# Patient Record
Sex: Female | Born: 1965 | Race: White | Hispanic: No | Marital: Married | Smoking: Never smoker
Health system: Southern US, Community
[De-identification: ages and names within clinical notes are randomized; demographics above are authoritative.]

## PROBLEM LIST (undated history)

## (undated) DIAGNOSIS — T7421XA Adult sexual abuse, confirmed, initial encounter: Secondary | ICD-10-CM

## (undated) DIAGNOSIS — B019 Varicella without complication: Secondary | ICD-10-CM

## (undated) DIAGNOSIS — M810 Age-related osteoporosis without current pathological fracture: Secondary | ICD-10-CM

## (undated) DIAGNOSIS — C3490 Malignant neoplasm of unspecified part of unspecified bronchus or lung: Secondary | ICD-10-CM

## (undated) DIAGNOSIS — L508 Other urticaria: Secondary | ICD-10-CM

## (undated) DIAGNOSIS — G43909 Migraine, unspecified, not intractable, without status migrainosus: Secondary | ICD-10-CM

## (undated) DIAGNOSIS — R32 Unspecified urinary incontinence: Secondary | ICD-10-CM

## (undated) DIAGNOSIS — R87619 Unspecified abnormal cytological findings in specimens from cervix uteri: Secondary | ICD-10-CM

## (undated) DIAGNOSIS — A64 Unspecified sexually transmitted disease: Secondary | ICD-10-CM

## (undated) DIAGNOSIS — K219 Gastro-esophageal reflux disease without esophagitis: Secondary | ICD-10-CM

## (undated) DIAGNOSIS — T7840XA Allergy, unspecified, initial encounter: Secondary | ICD-10-CM

## (undated) DIAGNOSIS — J9819 Other pulmonary collapse: Secondary | ICD-10-CM

## (undated) DIAGNOSIS — F419 Anxiety disorder, unspecified: Secondary | ICD-10-CM

## (undated) DIAGNOSIS — O020 Blighted ovum and nonhydatidiform mole: Secondary | ICD-10-CM

## (undated) HISTORY — DX: Migraine, unspecified, not intractable, without status migrainosus: G43.909

## (undated) HISTORY — DX: Other pulmonary collapse: J98.19

## (undated) HISTORY — DX: Anxiety disorder, unspecified: F41.9

## (undated) HISTORY — DX: Unspecified urinary incontinence: R32

## (undated) HISTORY — PX: GYNECOLOGIC CRYOSURGERY: SHX857

## (undated) HISTORY — DX: Age-related osteoporosis without current pathological fracture: M81.0

## (undated) HISTORY — DX: Malignant neoplasm of unspecified part of unspecified bronchus or lung: C34.90

## (undated) HISTORY — DX: Varicella without complication: B01.9

## (undated) HISTORY — DX: Blighted ovum and nonhydatidiform mole: O02.0

## (undated) HISTORY — DX: Unspecified sexually transmitted disease: A64

## (undated) HISTORY — DX: Other urticaria: L50.8

## (undated) HISTORY — PX: WRIST SURGERY: SHX841

## (undated) HISTORY — DX: Adult sexual abuse, confirmed, initial encounter: T74.21XA

## (undated) HISTORY — DX: Unspecified abnormal cytological findings in specimens from cervix uteri: R87.619

## (undated) HISTORY — DX: Gastro-esophageal reflux disease without esophagitis: K21.9

## (undated) HISTORY — DX: Allergy, unspecified, initial encounter: T78.40XA

## (undated) HISTORY — PX: EYE SURGERY: SHX253

---
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
## 2017-02-12 HISTORY — PX: COLONOSCOPY: SHX174

## 2017-11-28 DIAGNOSIS — K219 Gastro-esophageal reflux disease without esophagitis: Secondary | ICD-10-CM

## 2017-11-28 DIAGNOSIS — N941 Unspecified dyspareunia: Secondary | ICD-10-CM | POA: Insufficient documentation

## 2017-11-28 DIAGNOSIS — J301 Allergic rhinitis due to pollen: Secondary | ICD-10-CM

## 2017-11-28 DIAGNOSIS — Z780 Asymptomatic menopausal state: Secondary | ICD-10-CM | POA: Insufficient documentation

## 2017-11-28 HISTORY — DX: Allergic rhinitis due to pollen: J30.1

## 2017-11-28 HISTORY — DX: Unspecified dyspareunia: N94.10

## 2017-11-28 HISTORY — DX: Gastro-esophageal reflux disease without esophagitis: K21.9

## 2017-12-22 DIAGNOSIS — M810 Age-related osteoporosis without current pathological fracture: Secondary | ICD-10-CM | POA: Insufficient documentation

## 2018-02-10 DIAGNOSIS — Z86010 Personal history of colonic polyps: Secondary | ICD-10-CM | POA: Insufficient documentation

## 2018-03-06 ENCOUNTER — Other Ambulatory Visit: Payer: Self-pay

## 2018-03-06 ENCOUNTER — Ambulatory Visit: Payer: 59 | Admitting: Certified Nurse Midwife

## 2018-03-06 ENCOUNTER — Other Ambulatory Visit (HOSPITAL_COMMUNITY)
Admission: RE | Admit: 2018-03-06 | Discharge: 2018-03-06 | Disposition: A | Discharge status: Home / Self Care | Payer: 59 | Source: Ambulatory Visit | Attending: Certified Nurse Midwife | Admitting: Certified Nurse Midwife

## 2018-03-06 ENCOUNTER — Encounter: Payer: Self-pay | Admitting: Certified Nurse Midwife

## 2018-03-06 VITALS — BP 120/78 | HR 68 | Resp 16 | Ht 61.25 in | Wt 115.0 lb

## 2018-03-06 DIAGNOSIS — Z124 Encounter for screening for malignant neoplasm of cervix: Secondary | ICD-10-CM | POA: Diagnosis not present

## 2018-03-06 DIAGNOSIS — Z01419 Encounter for gynecological examination (general) (routine) without abnormal findings: Secondary | ICD-10-CM

## 2018-03-06 DIAGNOSIS — N951 Menopausal and female climacteric states: Secondary | ICD-10-CM

## 2018-03-06 DIAGNOSIS — Z8739 Personal history of other diseases of the musculoskeletal system and connective tissue: Secondary | ICD-10-CM

## 2018-03-06 NOTE — Patient Instructions (Signed)
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
## 2018-03-10 LAB — CYTOLOGY - PAP
Diagnosis: NEGATIVE
HPV: NOT DETECTED

## 2018-03-14 ENCOUNTER — Ambulatory Visit: Payer: Self-pay | Admitting: Family Medicine

## 2018-03-19 ENCOUNTER — Telehealth: Payer: Self-pay | Admitting: Certified Nurse Midwife

## 2018-03-19 ENCOUNTER — Other Ambulatory Visit: Payer: Self-pay

## 2018-03-19 ENCOUNTER — Encounter: Payer: Self-pay | Admitting: Certified Nurse Midwife

## 2018-03-19 ENCOUNTER — Ambulatory Visit: Payer: 59 | Admitting: Certified Nurse Midwife

## 2018-03-19 VITALS — BP 110/64 | HR 68 | Temp 98.2°F | Resp 16 | Wt 117.0 lb

## 2018-03-19 DIAGNOSIS — N952 Postmenopausal atrophic vaginitis: Secondary | ICD-10-CM | POA: Diagnosis not present

## 2018-03-19 DIAGNOSIS — Z01419 Encounter for gynecological examination (general) (routine) without abnormal findings: Secondary | ICD-10-CM

## 2018-03-19 MED ORDER — ESTRADIOL 10 MCG VA TABS: ORAL_TABLET | VAGINAL | 1 refills | Status: DC

## 2018-03-19 NOTE — Telephone Encounter (Signed)
Message   Hello Neoma Laming,    I forgot to right down the name of the cream you recommend to put on when I am in the shower. Would you please let me know.    Thank-you,  Messiah

## 2018-03-19 NOTE — Telephone Encounter (Signed)
Left message to call Saurav Crumble, RN at [facility] [phone].   

## 2018-03-19 NOTE — Telephone Encounter (Signed)
Call reviewed with Melvia Heaps, CNM, creamy baby oil recommended. MyChart message to patient.   Encounter closed.

## 2018-03-19 NOTE — Patient Instructions (Signed)
Atrophic Vaginitis Atrophic vaginitis is a condition in which the tissues that line the vagina become dry and thin. This condition occurs in women who have stopped having their period. It is caused by a drop in a female hormone (estrogen). This hormone helps:  To keep the vagina moist.  To make a clear fluid. This clear fluid helps: ? To make the vagina ready for sex. ? To protect the vagina from infection. If the lining of the vagina is dry and thin, it may cause irritation, burning, or itchiness. It may also:  Make sex painful.  Make an exam of your vagina painful.  Cause bleeding.  Make you lose interest in sex.  Cause a burning feeling when you pee (urinate).  Cause a brown or yellow fluid to come from your vagina. Some women do not have symptoms. Follow these instructions at home: Medicines  Take over-the-counter and prescription medicines only as told by your doctor.  Do not use herbs or other medicines unless your doctor says it is okay.  Use medicines for for dryness. These include: ? Oils to make the vagina soft. ? Creams. ? Moisturizers. General instructions  Do not douche.  Do not use products that can make your vagina dry. These include: ? Scented sprays. ? Scented tampons. ? Scented soaps.  Sex can help increase blood flow and soften the tissue in the vagina. If it hurts to have sex: ? Tell your partner. ? Use products to make sex more comfortable. Use these only as told by your doctor. Contact a doctor if you:  Have discharge from the vagina that is different than usual.  Have a bad smell coming from your vagina.  Have new symptoms.  Do not get better.  Get worse. Summary  Atrophic vaginitis is a condition in which the lining of the vagina becomes dry and thin.  This condition affects women who have stopped having their periods.  Treatment may include using products that help make the vagina soft.  Call a doctor if do not get better with  treatment. This information is not intended to replace advice given to you by your health care provider. Make sure you discuss any questions you have with your health care provider. Document Released: 07/18/2007 Document Revised: 02/11/2017 Document Reviewed: 02/11/2017 Elsevier Interactive Patient Education  2019 [employer].

## 2018-03-19 NOTE — Progress Notes (Signed)
Review of Systems  Constitutional: Positive for malaise/fatigue. Negative for chills and fever.  HENT: Negative.   Eyes: Negative.   Respiratory: Negative.   Cardiovascular: Negative.   Gastrointestinal: Negative.   Genitourinary: Negative.        Vaginal dryness and discomfort  Musculoskeletal: Positive for myalgias.       Treated for flu 4 days ago  Skin: Negative.   Neurological: Negative.   Endo/Heme/Allergies: Negative.   Psychiatric/Behavioral: Negative.     53 y.o. Married Caucasian female G2P0010 here for follow up of vaginal dryness treated with coconut oil initiated on 03/07/2018. Patient has continued to use nightly until she contracted the flu. Treated with Tamiflu and OTC cough medication. Onset 4 days ago. Feeling much better, but still fatigued. Patient still having hot flashes occasional at hs. Sleeping better at times. Ready to feel better. "Need to get the vaginal dryness resolved". Denies vaginal bleeding. No other health issues today.   O: Healthy WD,WN female Affect: Normal, orientation x 3 Skin:warm and dry Abdomen:soft, non tender Inguinal lymph nodes : no enlargement or tenderness Pelvic exam:EXTERNAL GENITALIA: normal appearing vulva with no masses,  or lesions. Dryness noted with slight increase pink, no exudate or scaling VAGINA: atrophic appearance with pale color and scant moisture, slightly tender CERVIX: no lesions or cervical motion tenderness and normal appearance UTERUS: anteverted and non tender, no masses ADNEXA: no masses palpable and nontender RECTUM: normal appearance, no redness or skin irritation noted  A: Atrophic vaginitis using coconut oil for moisture, but still having discomfort. Normal pelvic exam Menopausal symptomatic Recent treatment for the Flu   P: Discussed findings of Atrophic vaginitis and discussed etiology again. Discussed small improvement with coconut oil moisture. Discussed Vagifem use for atrophic changes.  Risks/benefits/warning signs discussed. Patient would like to try, due to discomfort. Discussed she will absorb small amount systemically, which may help with hot flashes. Questions addressed. Rx Vagifem(generic) one tablet vaginally every hs x 1 week then twice weekly. Printed Rx given to patient at her request to price compare. Warning signs given and need to advise if occurs. Continue coconut oil at least twice weekly and with sexual activity. Continue follow up with PCP as indicated.  Rv one month

## 2018-04-24 ENCOUNTER — Encounter: Payer: Self-pay | Admitting: Certified Nurse Midwife

## 2018-04-24 ENCOUNTER — Other Ambulatory Visit: Payer: Self-pay

## 2018-04-24 ENCOUNTER — Ambulatory Visit (INDEPENDENT_AMBULATORY_CARE_PROVIDER_SITE_OTHER): Payer: 59 | Admitting: Certified Nurse Midwife

## 2018-04-24 VITALS — BP 120/78 | HR 60 | Resp 16 | Wt 114.0 lb

## 2018-04-24 DIAGNOSIS — N952 Postmenopausal atrophic vaginitis: Secondary | ICD-10-CM | POA: Diagnosis not present

## 2018-04-24 MED ORDER — ESTRADIOL 10 MCG VA TABS: ORAL_TABLET | VAGINAL | 1 refills | Status: DC

## 2018-04-24 NOTE — Progress Notes (Signed)
Review of Systems  Constitutional: Negative.   HENT: Negative.   Eyes: Negative.   Respiratory: Negative.   Cardiovascular: Negative.   Gastrointestinal: Negative.   Genitourinary: Negative.   Musculoskeletal: Negative.   Skin: Negative.   Neurological: Negative.   Endo/Heme/Allergies: Negative.   Psychiatric/Behavioral: Negative.     53 y.o. Married Caucasian female G2P0020 here for follow up of atrophic vaginitis related to menopause treated with Vagifem initiated on 03/19/2018. Patient using medication as directed every hs x 1 week and now twice weekly with coconut oil the other days. Feels less tender but not 100%. Denies vaginal bleeding or other concerns with medication use. Thankful it is improving.   O: Healthy WD,WN female Affect: normal, orientation x 3 Skin:warm and dry Abdomen:soft, non tender Inguinal lymph nodes not enlarged Pelvic exam:EXTERNAL GENITALIA: normal appearing vulva with no masses, tenderness or lesions VAGINA: no abnormal discharge or lesions and moisture noted in posture fornix, slightly tender with exam, improvement noted CERVIX: no lesions or cervical motion tenderness UTERUS: anteverted and non tender, no masse ADNEXA: no masses palpable and nontender RECTUM: normal appearance  A: Normal pelvic exam Atrophic Vaginitis improving with Vagifem use    P: Discussed findings of improvement, but feel another week of nightly Vagifem would increase improvement. Patient agreeable to plan of nightly x 1 week, coconut oil as needed and then twice weekly with coconut oil other nights. Rx Vagifem see order with instructions  Rv 3 weeks, prn

## 2018-04-24 NOTE — Patient Instructions (Signed)
Atrophic Vaginitis    Atrophic vaginitis is a condition in which the tissues that line the vagina become dry and thin. This condition is most common in women who have stopped having regular menstrual periods (are in menopause). This usually starts when a woman is 45-53 years old. That is the time when a woman's estrogen levels begin to drop (decrease).  Estrogen is a female hormone. It helps to keep the tissues of the vagina moist. It stimulates the vagina to produce a clear fluid that lubricates the vagina for sexual intercourse. This fluid also protects the vagina from infection. Lack of estrogen can cause the lining of the vagina to get thinner and dryer. The vagina may also shrink in size. It may become less elastic. Atrophic vaginitis tends to get worse over time as a woman's estrogen level drops.  What are the causes?  This condition is caused by the normal drop in estrogen that happens around the time of menopause.  What increases the risk?  Certain conditions or situations may lower a woman's estrogen level, leading to a higher risk for atrophic vaginitis. You are more likely to develop this condition if:   You are taking medicines that block estrogen.   You have had your ovaries removed.   You are being treated for cancer with X-ray (radiation) or medicines (chemotherapy).   You have given birth or are breastfeeding.   You are older than age 50.   You smoke.  What are the signs or symptoms?  Symptoms of this condition include:   Pain, soreness, or bleeding during sexual intercourse (dyspareunia).   Vaginal burning, irritation, or itching.   Pain or bleeding when a speculum is used in a vaginal exam (pelvic exam).   Having burning pain when passing urine.   Vaginal discharge that is brown or yellow.  In some cases, there are no symptoms.  How is this diagnosed?  This condition is diagnosed by taking a medical history and doing a physical exam. This will include a pelvic exam that checks the  vaginal tissues. Though rare, you may also have other tests, including:   A urine test.   A test that checks the acid balance in your vagina (acid balance test).  How is this treated?  Treatment for this condition depends on how severe your symptoms are. Treatment may include:   Using an over-the-counter vaginal lubricant before sex.   Using a long-acting vaginal moisturizer.   Using low-dose vaginal estrogen for moderate to severe symptoms that do not respond to other treatments. Options include creams, tablets, and inserts (vaginal rings). Before you use a vaginal estrogen, tell your health care provider if you have a history of:  ? Breast cancer.  ? Endometrial cancer.  ? Blood clots.  If you are not sexually active and your symptoms are very mild, you may not need treatment.  Follow these instructions at home:  Medicines   Take over-the-counter and prescription medicines only as told by your health care provider. Do not use herbal or alternative medicines unless your health care provider says that you can.   Use over-the-counter creams, lubricants, or moisturizers for dryness only as directed by your health care provider.  General instructions   If your atrophic vaginitis is caused by menopause, discuss all of your menopause symptoms and treatment options with your health care provider.   Do not douche.   Do not use products that can make your vagina dry. These include:  ? Scented feminine sprays.  ?   Scented tampons.  ? Scented soaps.   Vaginal intercourse can help to improve blood flow and elasticity of vaginal tissue. If it hurts to have sex, try using a lubricant or moisturizer just before having intercourse.  Contact a health care provider if:   Your discharge looks different than normal.   Your vagina has an unusual smell.   You have new symptoms.   Your symptoms do not improve with treatment.   Your symptoms get worse.  Summary   Atrophic vaginitis is a condition in which the tissues that  line the vagina become dry and thin. It is most common in women who have stopped having regular menstrual periods (are in menopause).   Treatment options include using vaginal lubricants and low-dose vaginal estrogen.   Contact a health care provider if your vagina has an unusual smell, or if your symptoms get worse or do not improve after treatment.  This information is not intended to replace advice given to you by your health care provider. Make sure you discuss any questions you have with your health care provider.  Document Released: 06/15/2014 Document Revised: 10/25/2016 Document Reviewed: 10/25/2016  Elsevier Interactive Patient Education  2019 [employer]

## 2018-05-08 ENCOUNTER — Telehealth: Payer: Self-pay | Admitting: Certified Nurse Midwife

## 2018-05-08 NOTE — Telephone Encounter (Signed)
Left patient a message to call back to reschedule a future appointment that was cancelled by Melvia Heaps, CNM for 3 week recheck on vaginal dryness on 05/15/18.

## 2018-05-15 ENCOUNTER — Ambulatory Visit: Payer: 59 | Admitting: Certified Nurse Midwife

## 2018-05-15 ENCOUNTER — Encounter: Payer: Self-pay | Admitting: Obstetrics and Gynecology

## 2018-05-15 ENCOUNTER — Other Ambulatory Visit: Payer: Self-pay

## 2018-05-15 ENCOUNTER — Ambulatory Visit: Payer: 59 | Admitting: Obstetrics and Gynecology

## 2018-05-15 VITALS — BP 126/70 | HR 80 | Resp 12 | Ht 61.5 in | Wt 118.0 lb

## 2018-05-15 DIAGNOSIS — L509 Urticaria, unspecified: Secondary | ICD-10-CM

## 2018-05-15 DIAGNOSIS — N760 Acute vaginitis: Secondary | ICD-10-CM

## 2018-05-15 DIAGNOSIS — N952 Postmenopausal atrophic vaginitis: Secondary | ICD-10-CM

## 2018-05-15 MED ORDER — VALACYCLOVIR HCL 500 MG PO TABS: ORAL_TABLET | ORAL | 2 refills | Status: DC
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
## 2018-05-16 LAB — VAGINITIS/VAGINOSIS, DNA PROBE
Candida Species: NEGATIVE
Gardnerella vaginalis: NEGATIVE
Trichomonas vaginosis: NEGATIVE

## 2018-05-18 ENCOUNTER — Encounter: Payer: Self-pay | Admitting: Obstetrics and Gynecology

## 2018-06-27 ENCOUNTER — Ambulatory Visit: Payer: 59 | Admitting: Family Medicine

## 2018-06-27 ENCOUNTER — Encounter: Payer: Self-pay | Admitting: Family Medicine

## 2018-06-27 ENCOUNTER — Other Ambulatory Visit: Payer: Self-pay

## 2018-06-27 ENCOUNTER — Ambulatory Visit (INDEPENDENT_AMBULATORY_CARE_PROVIDER_SITE_OTHER): Payer: 59

## 2018-06-27 VITALS — BP 142/98 | HR 83 | Temp 98.5°F | Ht 61.0 in | Wt 117.8 lb

## 2018-06-27 DIAGNOSIS — R0602 Shortness of breath: Secondary | ICD-10-CM
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
## 2018-06-27 NOTE — Patient Instructions (Signed)
cxr and exam are normal. Will send to pulmonology to have you talk with them further.   F/u for annual in the fall!  So nice to meet you!  Dr/ Rogers Blocker

## 2018-06-30 ENCOUNTER — Encounter: Payer: Self-pay | Admitting: Family Medicine

## 2018-07-02 ENCOUNTER — Other Ambulatory Visit: Payer: Self-pay

## 2018-07-02 ENCOUNTER — Encounter: Payer: Self-pay | Admitting: Emergency Medicine

## 2018-07-02 ENCOUNTER — Ambulatory Visit: Payer: 59 | Admitting: Emergency Medicine

## 2018-07-02 VITALS — BP 108/76 | HR 84 | Ht 61.5 in | Wt 120.0 lb

## 2018-07-02 DIAGNOSIS — R0609 Other forms of dyspnea: Secondary | ICD-10-CM

## 2018-07-02 DIAGNOSIS — J849 Interstitial pulmonary disease, unspecified: Secondary | ICD-10-CM

## 2018-07-02 DIAGNOSIS — R0600 Dyspnea, unspecified: Secondary | ICD-10-CM | POA: Insufficient documentation
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
## 2018-07-02 NOTE — Assessment & Plan Note (Signed)
Slow progressive dyspnea on exertion.  She notes that her breathing was never quite the same after her pneumothorax in 2008 but has been more bothersome over the last 2 years.  Sometimes associated with what sounds like atypical chest discomfort.   It would be unusual for her to have significant restrictive disease due to her remote pneumothorax but I suppose this is possible.  I do not see any evidence of pleural disease on her chest x-ray.  She is a never smoker without a history of asthma and does not have any other symptoms consistent with obstructive lung disease.  I do think that occult obstructive lung disease needs to be ruled out.  We will arrange for pulmonary function testing.  Her chest x-ray looks for the most part normal but there may be some subtle interstitial changes present.  I think it is reasonable for [place] to check a CT scan of her chest to look further for ILD and also to better evaluate her left pleural space for possible scarring, source of restriction.  Finally an occult cause for dyspnea could be pulmonary hypertension.  If her other evaluation above is unrevealing then I think it would be reasonable to perform an echocardiogram to screen her pulmonary pressures.  I would also consider a cardiopulmonary exercise test at that time to rule out a ventilatory defect, look for possible cardiac contribution to her exertional symptoms.

## 2018-07-02 NOTE — Patient Instructions (Addendum)
We will perform full pulmonary function testing.  We will perform a high resolution CT chest Depending on the results above, we may decide to perform an echocardiogram and / or a cardiopulmonary exercise test Follow with Dr. Lamonte Sakai either in office or by virtual visit in 3 to 4 weeks to discuss results and plan next steps.

## 2018-07-09 ENCOUNTER — Telehealth: Payer: Self-pay | Admitting: Certified Nurse Midwife

## 2018-07-09 NOTE — Telephone Encounter (Signed)
Spoke with patient. Has been on vagifem twice wkly since 03/2018. Report increase in hot flashes, sweating at night and occasional breast tenderness. Denies vaginal bleeding. Reports vagifem in combination with coconut oil may be helping vaginal atrophy, but not sure. Has been experiencing  increased anxiety due to upcoming CT scan of chest in f/u to chest tube placement in 2008. Patient is unsure if the hot flashes are related.   Covid 19 prescreen completed.  Recommended WebEx with Dr. Quincy Simmonds, patient request OV. OV scheduled for 5/28 at 4pm.   Routing to provider for final review. Patient is agreeable to disposition. Will close encounter.

## 2018-07-09 NOTE — Telephone Encounter (Signed)
Patient would like to speak with nurse about a medication and some problems she's having.

## 2018-07-10 ENCOUNTER — Telehealth: Payer: Self-pay | Admitting: Obstetrics and Gynecology

## 2018-07-10 ENCOUNTER — Telehealth: Payer: Self-pay | Admitting: *Deleted

## 2018-07-10 ENCOUNTER — Ambulatory Visit: Payer: Self-pay | Admitting: Obstetrics and Gynecology

## 2018-07-10 NOTE — Telephone Encounter (Signed)
Patient cancelled her problem visit today because of a work meeting she was told about this morning. Would like appointment next week.
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
## 2018-07-10 NOTE — Telephone Encounter (Signed)
Spoke with patient, requesting to reschedule OV to next week with any available provider. Offered OV with Melvia Heaps, CNM on 6/8, patient declined. OV scheduled for 6/5 at 3pm with Dr. Sabra Heck.   Routing to provider for final review. Patient is agreeable to disposition. Will close encounter.

## 2018-07-10 NOTE — Telephone Encounter (Signed)

## 2018-07-11 ENCOUNTER — Ambulatory Visit (INDEPENDENT_AMBULATORY_CARE_PROVIDER_SITE_OTHER)
Admission: RE | Admit: 2018-07-11 | Discharge: 2018-07-11 | Disposition: A | Discharge status: Home / Self Care | Payer: 59 | Source: Ambulatory Visit | Attending: Emergency Medicine | Admitting: Emergency Medicine

## 2018-07-11 ENCOUNTER — Other Ambulatory Visit: Payer: Self-pay

## 2018-07-11 DIAGNOSIS — J849 Interstitial pulmonary disease, unspecified: Secondary | ICD-10-CM | POA: Diagnosis not present

## 2018-07-16 ENCOUNTER — Other Ambulatory Visit: Payer: Self-pay

## 2018-07-17 ENCOUNTER — Telehealth: Payer: Self-pay | Admitting: Emergency Medicine

## 2018-07-17 ENCOUNTER — Telehealth: Payer: Self-pay

## 2018-07-17 NOTE — Telephone Encounter (Signed)
Pt had HRCT performed 07/11/2018 and is calling requesting to know the results. Tonya, please advise on this for pt. Thanks!

## 2018-07-17 NOTE — Telephone Encounter (Signed)
Called and spoke with pt letting her know that CY will discuss results of CT at upcoming appt with him and pt verbalized understanding. Nothing further needed.

## 2018-07-17 NOTE — Telephone Encounter (Signed)
Attempted to call pt but unable to reach her. Left pt a detailed message (per DPR) letting her know that CT will be discussed at [place] with CY 6/15. Nothing further needed.

## 2018-07-17 NOTE — Telephone Encounter (Signed)
Patient has an appointment already shceduled with Dr. Annamaria Boots to go over results on 07/28/18. Patient should be aware of this appointment - it was discussed at her  last visit with Dr. Lamonte Sakai. Thanks.

## 2018-07-18 ENCOUNTER — Ambulatory Visit: Payer: 59 | Admitting: Obstetrics & Gynecology

## 2018-07-18 ENCOUNTER — Other Ambulatory Visit: Payer: Self-pay

## 2018-07-18 ENCOUNTER — Encounter: Payer: Self-pay | Admitting: Obstetrics & Gynecology

## 2018-07-18 VITALS — BP 116/86 | HR 76 | Temp 97.6°F | Ht 61.5 in | Wt 118.0 lb

## 2018-07-18 DIAGNOSIS — I2510 Atherosclerotic heart disease of native coronary artery without angina pectoris: Secondary | ICD-10-CM

## 2018-07-18 DIAGNOSIS — R232 Flushing: Secondary | ICD-10-CM

## 2018-07-18 DIAGNOSIS — N952 Postmenopausal atrophic vaginitis: Secondary | ICD-10-CM

## 2018-07-18 DIAGNOSIS — N898 Other specified noninflammatory disorders of vagina: Secondary | ICD-10-CM

## 2018-07-18 MED ORDER — GABAPENTIN 100 MG PO CAPS: ORAL_CAPSULE | ORAL | 0 refills | Status: DC
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
## 2018-07-19 LAB — VAGINITIS/VAGINOSIS, DNA PROBE
Candida Species: NEGATIVE
Gardnerella vaginalis: NEGATIVE
Trichomonas vaginosis: NEGATIVE

## 2018-07-21 LAB — COMPREHENSIVE METABOLIC PANEL
ALT: 11 IU/L (ref 0–32)
AST: 17 IU/L (ref 0–40)
Albumin/Globulin Ratio: 2.2 (ref 1.2–2.2)
Albumin: 4.7 g/dL (ref 3.8–4.9)
Alkaline Phosphatase: 74 IU/L (ref 39–117)
BUN/Creatinine Ratio: 18 (ref 9–23)
BUN: 13 mg/dL (ref 6–24)
Bilirubin Total: 0.3 mg/dL (ref 0.0–1.2)
CO2: 21 mmol/L (ref 20–29)
Calcium: 9.4 mg/dL (ref 8.7–10.2)
Chloride: 99 mmol/L (ref 96–106)
Creatinine, Ser: 0.71 mg/dL (ref 0.57–1.00)
GFR calc Af Amer: 112 mL/min/{1.73_m2} (ref >59)
GFR calc non Af Amer: 98 mL/min/{1.73_m2} (ref >59)
Globulin, Total: 2.1 g/dL (ref 1.5–4.5)
Glucose: 89 mg/dL (ref 65–99)
Potassium: 4 mmol/L (ref 3.5–5.2)
Sodium: 136 mmol/L (ref 134–144)
Total Protein: 6.8 g/dL (ref 6.0–8.5)

## 2018-07-21 LAB — TSH: TSH: 1.18 u[IU]/mL (ref 0.450–4.500)

## 2018-07-21 MED ORDER — NONFORMULARY OR COMPOUNDED ITEM: 3 refills | Status: DC

## 2018-07-21 NOTE — Addendum Note (Signed)
Addended by: Megan Salon on: 07/21/2018 03:09 PM   Modules accepted: Orders

## 2018-07-23 ENCOUNTER — Ambulatory Visit: Payer: 59 | Admitting: Primary Care

## 2018-07-28 ENCOUNTER — Other Ambulatory Visit: Payer: Self-pay

## 2018-07-28 ENCOUNTER — Ambulatory Visit: Payer: 59 | Admitting: Internal Medicine

## 2018-07-28 ENCOUNTER — Encounter: Payer: Self-pay | Admitting: Internal Medicine

## 2018-07-28 VITALS — BP 116/60 | HR 75 | Temp 98.8°F | Ht 61.5 in | Wt 116.8 lb

## 2018-07-28 DIAGNOSIS — R0609 Other forms of dyspnea: Secondary | ICD-10-CM | POA: Diagnosis not present

## 2018-07-28 DIAGNOSIS — I272 Pulmonary hypertension, unspecified: Secondary | ICD-10-CM

## 2018-07-28 DIAGNOSIS — L509 Urticaria, unspecified: Secondary | ICD-10-CM | POA: Insufficient documentation

## 2018-07-28 DIAGNOSIS — R0600 Dyspnea, unspecified: Secondary | ICD-10-CM

## 2018-07-28 NOTE — Assessment & Plan Note (Signed)
Dyspnea on exertion. There is some anxiety. We discussed available work-up, and lack of CT evidence for significant ILD or obvious residual impairment from remote pneumothorax.  Plan- Echocardiogram to exclude PHN, PFT, CBC w dif to exclude anemia and IgE, looking at allergy markers possibly pointing to asthma.  Consider methacholine Challenge, Cardiopulmonary stress test. Consider Cardiology referral for risk stratification given atherosclerosis and family hx.  She will f/u w Dr Lamonte Sakai when he is available

## 2018-07-28 NOTE — Progress Notes (Signed)
07/02/2018- Dr Lamonte Sakai 53 year old never smoker with history of chronic anxiety, urticaria.  She experienced a left pneumothorax in 2008 which required chest tube placement x 2, no sgy.  She is referred today for eval6/15/2020- 53 yoF never smoker seen 5/20 by Dr Lamonte Sakai "uation of slow progressive exertional dyspnea.  She describes exertional dyspnea, chest discomfort with exercise including fast walking. Barely able to converse when she exerts herself. She does not hear wheeze. Can sometimes feel pain w a deep breath, L anterior chest in the area where she had her anterior chest tubes. She has been treated for urticaria and there is some question of possible auto-immune disease > she has been seen by rheumatologist in the past, reports that her autoimmune labs were negative.  07/28/2018- Patient is here today for CT results. She reports SOB, more when going up and down stairs and very active. Being evaluated for DOE with remote hx pneumothorax. Dr Lamonte Sakai ordered CT and suggested Echo for PHN and cardiopulmonary stress test for consideration. Main concern is DOE. Uses a trainer for exercise in her yard. Says dyspnea and non-anginal persistent L arm and shoulder pain go back at least several years. No abrupt event. Little cough or wheeze. No personal hx of clotting disorder. Family hx of CAD, sister had PE, sister had repair ?congenital cardiac issue, possible septal defect.  She is anxious that she might be at increased Covid risk to undefined breathing disorder. Chest CT results reviewed with her.  CT chest- 07/11/18-  IMPRESSION: 1. No findings to suggest interstitial lung disease. 2. Moderate air trapping indicative of small airways disease. 3. Aortic atherosclerosis, in addition to left anterior descending coronary artery disease. Please note that although the presence of coronary artery calcium documents the presence of coronary artery disease, the severity of this disease and any potential  stenosis cannot be assessed on this non-gated CT examination. Assessment for potential risk factor modification, dietary therapy or pharmacologic therapy may be warranted, if clinically indicated. 4. Tiny pulmonary nodules measuring 3 mm or less in size. These are nonspecific, but statistically likely benign. No follow-up needed if patient is low-risk (and has no known or suspected primary neoplasm). Non-contrast chest CT can be considered in 12 months if patient is high-risk. This recommendation follows the consensus statement: Guidelines for Management of Incidental Pulmonary Nodules Detected on CT Images: From the Fleischner Society 2017; Radiology 2017; 284:228-243. Aortic Atherosclerosis (ICD10-I70.0).

## 2018-07-28 NOTE — Patient Instructions (Addendum)
Order- lab- CBC w diff, IgE level, D-dimer      Dx dyspnea on exertion  Order- schedule PFT      Order-  Schedule Echocardiogram   Dyspnea on exertion, question PHN  Please call as needed

## 2018-07-28 NOTE — Assessment & Plan Note (Signed)
Working with a Paediatric nurse who has considered MTX.  Not sure this relates to her dyspnea, unless she has occult allergic asthma.  Plan- IgE, Eos. Consider role of biologic agent in future.

## 2018-07-29 ENCOUNTER — Telehealth: Payer: Self-pay | Admitting: Internal Medicine

## 2018-07-29 LAB — CBC WITH DIFFERENTIAL/PLATELET
Basophils Absolute: 0.1 10*3/uL (ref 0.0–0.1)
Basophils Relative: 0.8 % (ref 0.0–3.0)
Eosinophils Absolute: 0.1 10*3/uL (ref 0.0–0.7)
Eosinophils Relative: 2 % (ref 0.0–5.0)
HCT: 40.2 % (ref 36.0–46.0)
Hemoglobin: 13.8 g/dL (ref 12.0–15.0)
Lymphocytes Relative: 45.1 % (ref 12.0–46.0)
Lymphs Abs: 2.9 10*3/uL (ref 0.7–4.0)
MCHC: 34.3 g/dL (ref 30.0–36.0)
MCV: 91 fl (ref 78.0–100.0)
Monocytes Absolute: 0.5 10*3/uL (ref 0.1–1.0)
Monocytes Relative: 7.6 % (ref 3.0–12.0)
Neutro Abs: 2.8 10*3/uL (ref 1.4–7.7)
Neutrophils Relative %: 44.5 % (ref 43.0–77.0)
Platelets: 394 10*3/uL (ref 150.0–400.0)
RBC: 4.41 Mil/uL (ref 3.87–5.11)
RDW: 12.5 % (ref 11.5–15.5)
WBC: 6.4 10*3/uL (ref 4.0–10.5)

## 2018-07-29 LAB — D-DIMER, QUANTITATIVE: D-Dimer, Quant: 0.29 mcg/mL FEU (ref <0.50)

## 2018-07-29 LAB — IGE: IgE (Immunoglobulin E), Serum: <2 kU/L (ref <=114)

## 2018-07-29 NOTE — Telephone Encounter (Signed)
Labs- CBC blood cell count and D-dimer test for blood clot were both normal.  Pt aware of results. Nothing further needed.

## 2018-07-30 ENCOUNTER — Telehealth (HOSPITAL_COMMUNITY): Payer: Self-pay | Admitting: Radiology

## 2018-07-30 DIAGNOSIS — R931 Abnormal findings on diagnostic imaging of heart and coronary circulation: Secondary | ICD-10-CM | POA: Insufficient documentation

## 2018-07-30 NOTE — Telephone Encounter (Signed)
Left message with echo instructions. No COVID screening

## 2018-07-31 ENCOUNTER — Other Ambulatory Visit (HOSPITAL_COMMUNITY): Payer: 59

## 2018-08-22 ENCOUNTER — Other Ambulatory Visit: Payer: Self-pay | Admitting: Obstetrics & Gynecology

## 2018-08-25 NOTE — Telephone Encounter (Signed)
Medication refill request: Gabapentin  Last AEX:  03/06/18 Next AEX:  Nothing scheduled at this time  Last MMG (if hormonal medication request): none Refill authorized:#[id number] with 2 RF

## 2018-08-25 NOTE — Telephone Encounter (Signed)
Please call pt and get an update on her gabapentin use.  What dosage is she taking at this time and how much has it helped?  Thanks.

## 2018-08-27 NOTE — Telephone Encounter (Signed)
Patient scheduled for Office visit on 09/04/18

## 2018-08-29 ENCOUNTER — Ambulatory Visit: Payer: 59 | Admitting: Cardiology

## 2018-09-04 ENCOUNTER — Ambulatory Visit: Payer: 59 | Admitting: Obstetrics & Gynecology

## 2018-09-04 ENCOUNTER — Encounter: Payer: Self-pay | Admitting: Obstetrics & Gynecology

## 2018-09-04 ENCOUNTER — Other Ambulatory Visit: Payer: Self-pay

## 2018-09-04 VITALS — BP 96/70 | HR 64 | Temp 97.7°F | Ht 61.5 in | Wt 119.0 lb

## 2018-09-04 DIAGNOSIS — N952 Postmenopausal atrophic vaginitis: Secondary | ICD-10-CM

## 2018-09-04 DIAGNOSIS — Z780 Asymptomatic menopausal state: Secondary | ICD-10-CM

## 2018-09-04 MED ORDER — GABAPENTIN 100 MG PO CAPS: ORAL_CAPSULE | ORAL | 0 refills | Status: DC
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
## 2018-09-05 ENCOUNTER — Other Ambulatory Visit: Payer: Self-pay

## 2018-09-05 MED ORDER — GABAPENTIN 100 MG PO CAPS: ORAL_CAPSULE | ORAL | 0 refills | Status: DC

## 2018-09-05 NOTE — Telephone Encounter (Signed)
Medication refill request: Gabapentin 100 Mg  Last OV: 09/04/18 Next AEX:03/11/19 DL Last MMG (if hormonal medication request): NONE Refill authorized: #[id number] with 0 RF   Pharmacy sent note on fax stating "Insurance requires exact direction as well as final dose. Please correct and resend. "

## 2018-09-08 NOTE — Telephone Encounter (Signed)
Denise Hughes from [facility] is calling regarding refill request for gabapentin, stating that they need clarification on the directions.

## 2018-09-08 NOTE — Telephone Encounter (Signed)
Spoke with Will at [facility]. Gabapentin instructions reviewed as ordered by Dr. Sabra Heck on 09/05/18, read back and confirmed. Questions answered.   Encounter closed.

## 2018-09-10 ENCOUNTER — Encounter: Payer: Self-pay | Admitting: Family Medicine

## 2018-09-10 ENCOUNTER — Other Ambulatory Visit: Payer: Self-pay

## 2018-09-10 ENCOUNTER — Ambulatory Visit (INDEPENDENT_AMBULATORY_CARE_PROVIDER_SITE_OTHER): Payer: 59 | Admitting: Family Medicine

## 2018-09-10 VITALS — BP 100/68 | HR 68 | Temp 98.0°F | Resp 14 | Ht 61.0 in | Wt 120.2 lb

## 2018-09-10 DIAGNOSIS — R0602 Shortness of breath: Secondary | ICD-10-CM

## 2018-09-10 DIAGNOSIS — E782 Mixed hyperlipidemia: Secondary | ICD-10-CM | POA: Diagnosis not present

## 2018-09-10 DIAGNOSIS — M810 Age-related osteoporosis without current pathological fracture: Secondary | ICD-10-CM

## 2018-09-10 LAB — LIPID PANEL
Cholesterol: 167 mg/dL (ref 0–200)
HDL: 60.2 mg/dL (ref >39.00)
LDL Cholesterol: 93 mg/dL (ref 0–99)
NonHDL: 107.08
Total CHOL/HDL Ratio: 3
Triglycerides: 68 mg/dL (ref 0.0–149.0)
VLDL: 13.6 mg/dL (ref 0.0–40.0)

## 2018-09-10 LAB — COMPREHENSIVE METABOLIC PANEL
ALT: 15 U/L (ref 0–35)
AST: 15 U/L (ref 0–37)
Albumin: 4.6 g/dL (ref 3.5–5.2)
Alkaline Phosphatase: 67 U/L (ref 39–117)
BUN: 14 mg/dL (ref 6–23)
CO2: 27 mEq/L (ref 19–32)
Calcium: 9.5 mg/dL (ref 8.4–10.5)
Chloride: 101 mEq/L (ref 96–112)
Creatinine, Ser: 0.68 mg/dL (ref 0.40–1.20)
GFR: 90.44 mL/min (ref >60.00)
Glucose, Bld: 85 mg/dL (ref 70–99)
Potassium: 4.3 mEq/L (ref 3.5–5.1)
Sodium: 135 mEq/L (ref 135–145)
Total Bilirubin: 0.6 mg/dL (ref 0.2–1.2)
Total Protein: 6.8 g/dL (ref 6.0–8.3)

## 2018-09-10 LAB — TSH: TSH: 1.68 u[IU]/mL (ref 0.35–4.50)
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
## 2018-09-10 NOTE — Patient Instructions (Addendum)
Please return in 8-12 weeks with Dr. Rogers Blocker for f/u of cholesterol and to discuss osteoporosis.  Please start the crestor nightly.   If you have any questions or concerns, please don't hesitate to send me a message via MyChart or call the office at [phone]. Thank you for visiting with Denise Hughes today! It's our pleasure caring for you.  Rosuvastatin Tablets What is this medicine? ROSUVASTATIN (roe SOO va sta tin) is known as a HMG-CoA reductase inhibitor or 'statin'. It lowers cholesterol and triglycerides in the blood. This drug may also reduce the risk of heart attack, stroke, or other health problems in patients with risk factors for heart disease. Diet and lifestyle changes are often used with this drug. This medicine may be used for other purposes; ask your health care provider or pharmacist if you have questions. COMMON BRAND NAME(S): Crestor What should I tell my health care provider before I take this medicine? They need to know if you have any of these conditions:  diabetes  if you often drink alcohol  history of stroke  kidney disease  liver disease  muscle aches or weakness  thyroid disease  an unusual or allergic reaction to rosuvastatin, other medicines, foods, dyes, or preservatives  pregnant or trying to get pregnant  breast-feeding How should I use this medicine? Take this medicine by mouth with a glass of water. Follow the directions on the prescription label. Do not cut, crush or chew this medicine. You can take this medicine with or without food. Take your doses at regular intervals. Do not take your medicine more often than directed. Talk to your pediatrician regarding the use of this medicine in children. While this drug may be prescribed for children as young as 48 years old for selected conditions, precautions do apply. Overdosage: If you think you have taken too much of this medicine contact a poison control center or emergency room at once. NOTE: This medicine  is only for you. Do not share this medicine with others. What if I miss a dose? If you miss a dose, take it as soon as you can. If your next dose is to be taken in less than 12 hours, then do not take the missed dose. Take the next dose at your regular time. Do not take double or extra doses. What may interact with this medicine? Do not take this medicine with any of the following medications:  herbal medicines like red yeast rice This medicine may also interact with the following medications:  alcohol  antacids containing aluminum hydroxide or magnesium hydroxide  cyclosporine  other medicines for high cholesterol  some medicines for HIV infection  warfarin This list may not describe all possible interactions. Give your health care provider a list of all the medicines, herbs, non-prescription drugs, or dietary supplements you use. Also tell them if you smoke, drink alcohol, or use illegal drugs. Some items may interact with your medicine. What should I watch for while using this medicine? Visit your doctor or health care professional for regular check-ups. You may need regular tests to make sure your liver is working properly. Your health care professional may tell you to stop taking this medicine if you develop muscle problems. If your muscle problems do not go away after stopping this medicine, contact your health care professional. Do not become pregnant while taking this medicine. Women should inform their health care professional if they wish to become pregnant or think they might be pregnant. There is a potential for serious  side effects to an unborn child. Talk to your health care professional or pharmacist for more information. Do not breast-feed an infant while taking this medicine. This medicine may increase blood sugar. Ask your healthcare provider if changes in diet or medicines are needed if you have diabetes. If you are going to need surgery or other procedure, tell your  doctor that you are using this medicine. This drug is only part of a total heart-health program. Your doctor or a dietician can suggest a low-cholesterol and low-fat diet to help. Avoid alcohol and smoking, and keep a proper exercise schedule. This medicine may cause a decrease in Co-Enzyme Q-10. You should make sure that you get enough Co-Enzyme Q-10 while you are taking this medicine. Discuss the foods you eat and the vitamins you take with your health care professional. What side effects may I notice from receiving this medicine? Side effects that you should report to your doctor or health care professional as soon as possible:  allergic reactions like skin rash, itching or hives, swelling of the face, lips, or tongue  confusion  joint pain  loss of memory  redness, blistering, peeling or loosening of the skin, including inside the mouth  signs and symptoms of high blood sugar such as being more thirsty or hungry or having to urinate more than normal. You may also feel very tired or have blurry vision.  signs and symptoms of muscle injury like dark urine; trouble passing urine or change in the amount of urine; unusually weak or tired; muscle pain or side or back pain  yellowing of the eyes or skin Side effects that usually do not require medical attention (report to your doctor or health care professional if they continue or are bothersome):  constipation  diarrhea  dizziness  gas  headache  nausea  stomach pain  trouble sleeping  upset stomach This list may not describe all possible side effects. Call your doctor for medical advice about side effects. You may report side effects to FDA at 1-800-FDA-1088. Where should I keep my medicine? Keep out of the reach of children. Store at room temperature between 20 and 25 degrees C (68 and 77 degrees F). Keep container tightly closed (protect from moisture). Throw away any unused medicine after the expiration date. NOTE: This  sheet is a summary. It may not cover all possible information. If you have questions about this medicine, talk to your doctor, pharmacist, or health care provider.  2020 Elsevier/Gold Standard (2017-11-21 08:25:08)

## 2018-09-11 ENCOUNTER — Encounter: Payer: Self-pay | Admitting: Family Medicine

## 2018-10-17 ENCOUNTER — Other Ambulatory Visit: Payer: Self-pay | Admitting: Certified Nurse Midwife

## 2018-10-17 DIAGNOSIS — N952 Postmenopausal atrophic vaginitis: Secondary | ICD-10-CM

## 2018-10-17 NOTE — Telephone Encounter (Signed)
Medication refill request: Estradiol Last AEX:  03/06/2018 DL Next AEX: 03/11/2019 Last MMG (if hormonal medication request): none Refill authorized: Pending authorization. #[id number] with no refills if appropriate. Please advise. DL out of the office until 10/21/2018.

## 2018-10-23 ENCOUNTER — Ambulatory Visit: Payer: 59 | Admitting: Cardiovascular Disease

## 2018-10-24 ENCOUNTER — Encounter (HOSPITAL_COMMUNITY): Payer: Self-pay | Admitting: Internal Medicine

## 2018-10-25 ENCOUNTER — Other Ambulatory Visit: Payer: Self-pay | Admitting: Certified Nurse Midwife

## 2018-10-25 DIAGNOSIS — N952 Postmenopausal atrophic vaginitis: Secondary | ICD-10-CM

## 2018-10-27 ENCOUNTER — Telehealth (HOSPITAL_COMMUNITY): Payer: Self-pay

## 2018-10-27 NOTE — Telephone Encounter (Signed)
New message    Just an FYI. We have made several attempts to contact this patient including sending a letter to schedule or reschedule their echocardiogram. We will be removing the patient from the echo WQ.   9.11.20 mail reminder letter Jodette Wik  8.6.20 @ 3:00pm lm on home vm Juanya Villavicencio 6.19.20 @ 10:26am lm on home vm - Stevana Dufner

## 2018-11-03 ENCOUNTER — Other Ambulatory Visit: Payer: Self-pay | Admitting: Obstetrics & Gynecology

## 2018-11-03 NOTE — Telephone Encounter (Signed)
Message left to return call to Rei Medlen at [phone].    

## 2018-11-03 NOTE — Telephone Encounter (Signed)
Medication refill request: Gabapentin  Last AEX:  03-06-2018 SM  Next AEX: 03-11-19  Last MMG (if hormonal medication request): n/a Refill authorized: Today, please advise.   Patient returned call. Patient states she has been taking 5 pills a night of the gabapentin. States hot flashes have not resolved, but have improved. Patient states she would be willing to try something else if Dr. Sabra Heck had anything to recommend. RN advised would send request to Dr. Sabra Heck. Patient agreeable. [facility]acy confirmed as [facility] in [facility].

## 2018-11-03 NOTE — Telephone Encounter (Signed)
Requesting refill on gabapentin. Denise Hughes in [place] at [phone].

## 2018-11-05 ENCOUNTER — Other Ambulatory Visit: Payer: Self-pay

## 2018-11-05 ENCOUNTER — Ambulatory Visit (INDEPENDENT_AMBULATORY_CARE_PROVIDER_SITE_OTHER): Payer: 59 | Admitting: Family Medicine

## 2018-11-05 ENCOUNTER — Encounter: Payer: Self-pay | Admitting: Family Medicine

## 2018-11-05 VITALS — BP 116/80 | HR 74 | Temp 98.1°F | Ht 61.0 in | Wt 121.4 lb

## 2018-11-05 DIAGNOSIS — F32A Depression, unspecified: Secondary | ICD-10-CM

## 2018-11-05 DIAGNOSIS — F40243 Fear of flying: Secondary | ICD-10-CM | POA: Diagnosis not present

## 2018-11-05 DIAGNOSIS — F419 Anxiety disorder, unspecified: Secondary | ICD-10-CM | POA: Diagnosis not present

## 2018-11-05 DIAGNOSIS — F329 Major depressive disorder, single episode, unspecified: Secondary | ICD-10-CM | POA: Diagnosis not present

## 2018-11-05 DIAGNOSIS — E782 Mixed hyperlipidemia: Secondary | ICD-10-CM | POA: Diagnosis not present

## 2018-11-05 MED ORDER — VENLAFAXINE HCL ER 75 MG PO CP24: 75.0000 mg | ORAL_CAPSULE | Freq: Every day | ORAL | 1 refills | Status: DC

## 2018-11-05 MED ORDER — ALPRAZOLAM 0.25 MG PO TABS: ORAL_TABLET | ORAL | 0 refills | Status: DC

## 2018-11-05 MED ORDER — PRAVASTATIN SODIUM 10 MG PO TABS: 10.0000 mg | ORAL_TABLET | Freq: Every day | ORAL | 1 refills | Status: DC

## 2018-11-05 MED ORDER — PRAVASTATIN SODIUM 20 MG PO TABS: 20.0000 mg | ORAL_TABLET | Freq: Every day | ORAL | 3 refills | Status: DC

## 2018-11-05 MED ORDER — GABAPENTIN 100 MG PO CAPS: ORAL_CAPSULE | ORAL | 5 refills | Status: DC

## 2018-11-05 NOTE — Telephone Encounter (Signed)
Rx done for the 500mg  nightly gabapentin.  She was just started on Effexor which also can help with hot flashes so I do not want to add anything else right now.
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
## 2018-11-05 NOTE — Patient Instructions (Signed)
-starting you effexor to help with anxiety and depression as well as PTSD and hot flashes. Take it every morning. Will f/u with you in one month.   -xanax prn for flying. Take 1-2 tablets before flight. No alcohol with this.   -pravachol sent in 10mg . Try every other night. Would repeat lipids in 6 months.     Generalized Anxiety Disorder, Adult Generalized anxiety disorder (GAD) is a mental health disorder. People with this condition constantly worry about everyday events. Unlike normal anxiety, worry related to GAD is not triggered by a specific event. These worries also do not fade or get better with time. GAD interferes with life functions, including relationships, work, and school. GAD can vary from mild to severe. People with severe GAD can have intense waves of anxiety with physical symptoms (panic attacks). What are the causes? The exact cause of GAD is not known. What increases the risk? This condition is more likely to develop in:  Women.  People who have a family history of anxiety disorders.  People who are very shy.  People who experience very stressful life events, such as the death of a loved one.  People who have a very stressful family environment. What are the signs or symptoms? People with GAD often worry excessively about many things in their lives, such as their health and family. They may also be overly concerned about:  Doing well at work.  Being on time.  Natural disasters.  Friendships. Physical symptoms of GAD include:  Fatigue.  Muscle tension or having muscle twitches.  Trembling or feeling shaky.  Being easily startled.  Feeling like your heart is pounding or racing.  Feeling out of breath or like you cannot take a deep breath.  Having trouble falling asleep or staying asleep.  Sweating.  Nausea, diarrhea, or irritable bowel syndrome (IBS).  Headaches.  Trouble concentrating or remembering facts.  Restlessness.  Irritability.  How is this diagnosed? Your health care provider can diagnose GAD based on your symptoms and medical history. You will also have a physical exam. The health care provider will ask specific questions about your symptoms, including how severe they are, when they started, and if they come and go. Your health care provider may ask you about your use of alcohol or drugs, including prescription medicines. Your health care provider may refer you to a mental health specialist for further evaluation. Your health care provider will do a thorough examination and may perform additional tests to rule out other possible causes of your symptoms. To be diagnosed with GAD, a person must have anxiety that:  Is out of his or her control.  Affects several different aspects of his or her life, such as work and relationships.  Causes distress that makes him or her unable to take part in normal activities.  Includes at least three physical symptoms of GAD, such as restlessness, fatigue, trouble concentrating, irritability, muscle tension, or sleep problems. Before your health care provider can confirm a diagnosis of GAD, these symptoms must be present more days than they are not, and they must last for six months or longer. How is this treated? The following therapies are usually used to treat GAD:  Medicine. Antidepressant medicine is usually prescribed for long-term daily control. Antianxiety medicines may be added in severe cases, especially when panic attacks occur.  Talk therapy (psychotherapy). Certain types of talk therapy can be helpful in treating GAD by providing support, education, and guidance. Options include: ? Cognitive behavioral therapy (CBT).  People learn coping skills and techniques to ease their anxiety. They learn to identify unrealistic or negative thoughts and behaviors and to replace them with positive ones. ? Acceptance and commitment therapy (ACT). This treatment teaches people how to be  mindful as a way to cope with unwanted thoughts and feelings. ? Biofeedback. This process trains you to manage your body's response (physiological response) through breathing techniques and relaxation methods. You will work with a therapist while machines are used to monitor your physical symptoms.  Stress management techniques. These include yoga, meditation, and exercise. A mental health specialist can help determine which treatment is best for you. Some people see improvement with one type of therapy. However, other people require a combination of therapies. Follow these instructions at home:  Take over-the-counter and prescription medicines only as told by your health care provider.  Try to maintain a normal routine.  Try to anticipate stressful situations and allow extra time to manage them.  Practice any stress management or self-calming techniques as taught by your health care provider.  Do not punish yourself for setbacks or for not making progress.  Try to recognize your accomplishments, even if they are small.  Keep all follow-up visits as told by your health care provider. This is important. Contact a health care provider if:  Your symptoms do not get better.  Your symptoms get worse.  You have signs of depression, such as: ? A persistently sad, cranky, or irritable mood. ? Loss of enjoyment in activities that used to bring you joy. ? Change in weight or eating. ? Changes in sleeping habits. ? Avoiding friends or family members. ? Loss of energy for normal tasks. ? Feelings of guilt or worthlessness. Get help right away if:  You have serious thoughts about hurting yourself or others. If you ever feel like you may hurt yourself or others, or have thoughts about taking your own life, get help right away. You can go to your nearest emergency department or call:  Your local emergency services (911 in the [place]  A suicide crisis helpline, such as the [facility] at 409-153-0640. This is open 24 hours a day. Summary  Generalized anxiety disorder (GAD) is a mental health disorder that involves worry that is not triggered by a specific event.  People with GAD often worry excessively about many things in their lives, such as their health and family.  GAD may cause physical symptoms such as restlessness, trouble concentrating, sleep problems, frequent sweating, nausea, diarrhea, headaches, and trembling or muscle twitching.  A mental health specialist can help determine which treatment is best for you. Some people see improvement with one type of therapy. However, other people require a combination of therapies. This information is not intended to replace advice given to you by your health care provider. Make sure you discuss any questions you have with your health care provider. Document Released: 05/26/2012 Document Revised: 01/11/2017 Document Reviewed: 12/20/2015 Elsevier Patient Education  2020 [employer].

## 2018-11-06 NOTE — Telephone Encounter (Signed)
Prescription for Gabapentin faxed to [facility] in [facility]. Fax #: [phone].

## 2018-11-06 NOTE — Telephone Encounter (Signed)
Call to patient. Advised of message as seen below from Dr. Sabra Heck and patient verbalized understanding.   Will close encounter.

## 2018-11-13 ENCOUNTER — Ambulatory Visit: Payer: 59 | Admitting: Internal Medicine

## 2018-12-05 ENCOUNTER — Ambulatory Visit: Payer: 59 | Admitting: Family Medicine

## 2018-12-16 ENCOUNTER — Other Ambulatory Visit: Payer: Self-pay

## 2018-12-17 ENCOUNTER — Ambulatory Visit (INDEPENDENT_AMBULATORY_CARE_PROVIDER_SITE_OTHER): Payer: 59 | Admitting: Family Medicine

## 2018-12-17 ENCOUNTER — Encounter: Payer: Self-pay | Admitting: Family Medicine

## 2018-12-17 VITALS — BP 122/68 | HR 79 | Temp 98.1°F | Ht 61.0 in | Wt 123.8 lb

## 2018-12-17 DIAGNOSIS — R1013 Epigastric pain: Secondary | ICD-10-CM | POA: Diagnosis not present

## 2018-12-17 DIAGNOSIS — F329 Major depressive disorder, single episode, unspecified: Secondary | ICD-10-CM

## 2018-12-17 DIAGNOSIS — F419 Anxiety disorder, unspecified: Secondary | ICD-10-CM | POA: Diagnosis not present

## 2018-12-17 DIAGNOSIS — F32A Depression, unspecified: Secondary | ICD-10-CM

## 2018-12-17 LAB — CBC WITH DIFFERENTIAL/PLATELET
Basophils Absolute: 0 10*3/uL (ref 0.0–0.1)
Basophils Relative: 1 % (ref 0.0–3.0)
Eosinophils Absolute: 0.1 10*3/uL (ref 0.0–0.7)
Eosinophils Relative: 1.8 % (ref 0.0–5.0)
HCT: 39.6 % (ref 36.0–46.0)
Hemoglobin: 13.9 g/dL (ref 12.0–15.0)
Lymphocytes Relative: 49.4 % — ABNORMAL HIGH (ref 12.0–46.0)
Lymphs Abs: 2.5 10*3/uL (ref 0.7–4.0)
MCHC: 35 g/dL (ref 30.0–36.0)
MCV: 90.7 fl (ref 78.0–100.0)
Monocytes Absolute: 0.6 10*3/uL (ref 0.1–1.0)
Monocytes Relative: 11 % (ref 3.0–12.0)
Neutro Abs: 1.9 10*3/uL (ref 1.4–7.7)
Neutrophils Relative %: 36.8 % — ABNORMAL LOW (ref 43.0–77.0)
Platelets: 357 10*3/uL (ref 150.0–400.0)
RBC: 4.37 Mil/uL (ref 3.87–5.11)
RDW: 12.3 % (ref 11.5–15.5)
WBC: 5 10*3/uL (ref 4.0–10.5)

## 2018-12-17 LAB — COMPREHENSIVE METABOLIC PANEL
ALT: 18 U/L (ref 0–35)
AST: 17 U/L (ref 0–37)
Albumin: 5 g/dL (ref 3.5–5.2)
Alkaline Phosphatase: 78 U/L (ref 39–117)
BUN: 9 mg/dL (ref 6–23)
CO2: 27 mEq/L (ref 19–32)
Calcium: 9.6 mg/dL (ref 8.4–10.5)
Chloride: 98 mEq/L (ref 96–112)
Creatinine, Ser: 0.65 mg/dL (ref 0.40–1.20)
GFR: 95.17 mL/min (ref >60.00)
Glucose, Bld: 96 mg/dL (ref 70–99)
Potassium: 4.5 mEq/L (ref 3.5–5.1)
Sodium: 133 mEq/L — ABNORMAL LOW (ref 135–145)
Total Bilirubin: 0.5 mg/dL (ref 0.2–1.2)
Total Protein: 7.4 g/dL (ref 6.0–8.3)

## 2018-12-17 LAB — TSH: TSH: 1.52 u[IU]/mL (ref 0.35–4.50)

## 2018-12-17 LAB — LIPASE: Lipase: 21 U/L (ref 11.0–59.0)

## 2018-12-17 MED ORDER — PANTOPRAZOLE SODIUM 40 MG PO TBEC: 40.0000 mg | DELAYED_RELEASE_TABLET | Freq: Every day | ORAL | 1 refills | Status: DC

## 2018-12-17 MED ORDER — HYDROXYZINE HCL 25 MG PO TABS: 25.0000 mg | ORAL_TABLET | Freq: Three times a day (TID) | ORAL | 0 refills | Status: DC | PRN

## 2018-12-17 MED ORDER — SERTRALINE HCL 50 MG PO TABS: ORAL_TABLET | ORAL | 3 refills | Status: DC

## 2018-12-17 NOTE — Patient Instructions (Signed)
1) sending in protonix for you to start once daily in the AM 2) checking labs for h.pylori and other labs 3) trial of zoloft for anxiety. Sending in hydroxyzine as needed for anxiety/panic attacks. zoloft can make you more anxious in the beginning, but try to stick it out for 2 weeks.   See you back in one month.    Gastroesophageal Reflux Disease, Adult Gastroesophageal reflux (GER) happens when acid from the stomach flows up into the tube that connects the mouth and the stomach (esophagus). Normally, food travels down the esophagus and stays in the stomach to be digested. With GER, food and stomach acid sometimes move back up into the esophagus. You may have a disease called gastroesophageal reflux disease (GERD) if the reflux:  Happens often.  Causes frequent or very bad symptoms.  Causes problems such as damage to the esophagus. When this happens, the esophagus becomes sore and swollen (inflamed). Over time, GERD can make small holes (ulcers) in the lining of the esophagus. What are the causes? This condition is caused by a problem with the muscle between the esophagus and the stomach. When this muscle is weak or not normal, it does not close properly to keep food and acid from coming back up from the stomach. The muscle can be weak because of:  Tobacco use.  Pregnancy.  Having a certain type of hernia (hiatal hernia).  Alcohol use.  Certain foods and drinks, such as coffee, chocolate, onions, and peppermint. What increases the risk? You are more likely to develop this condition if you:  Are overweight.  Have a disease that affects your connective tissue.  Use NSAID medicines. What are the signs or symptoms? Symptoms of this condition include:  Heartburn.  Difficult or painful swallowing.  The feeling of having a lump in the throat.  A bitter taste in the mouth.  Bad breath.  Having a lot of saliva.  Having an upset or bloated stomach.  Belching.  Chest  pain. Different conditions can cause chest pain. Make sure you see your doctor if you have chest pain.  Shortness of breath or noisy breathing (wheezing).  Ongoing (chronic) cough or a cough at night.  Wearing away of the surface of teeth (tooth enamel).  Weight loss. How is this treated? Treatment will depend on how bad your symptoms are. Your doctor may suggest:  Changes to your diet.  Medicine.  Surgery. Follow these instructions at home: Eating and drinking   Follow a diet as told by your doctor. You may need to avoid foods and drinks such as: ? Coffee and tea (with or without caffeine). ? Drinks that contain alcohol. ? Energy drinks and sports drinks. ? Bubbly (carbonated) drinks or sodas. ? Chocolate and cocoa. ? Peppermint and mint flavorings. ? Garlic and onions. ? Horseradish. ? Spicy and acidic foods. These include peppers, chili powder, curry powder, vinegar, hot sauces, and BBQ sauce. ? Citrus fruit juices and citrus fruits, such as oranges, lemons, and limes. ? Tomato-based foods. These include red sauce, chili, salsa, and pizza with red sauce. ? Fried and fatty foods. These include donuts, french fries, potato chips, and high-fat dressings. ? High-fat meats. These include hot dogs, rib eye steak, sausage, ham, and bacon. ? High-fat dairy items, such as whole milk, butter, and cream cheese.  Eat small meals often. Avoid eating large meals.  Avoid drinking large amounts of liquid with your meals.  Avoid eating meals during the 2-3 hours before bedtime.  Avoid lying  down right after you eat.  Do not exercise right after you eat. Lifestyle   Do not use any products that contain nicotine or tobacco. These include cigarettes, e-cigarettes, and chewing tobacco. If you need help quitting, ask your doctor.  Try to lower your stress. If you need help doing this, ask your doctor.  If you are overweight, lose an amount of weight that is healthy for you. Ask your  doctor about a safe weight loss goal. General instructions  Pay attention to any changes in your symptoms.  Take over-the-counter and prescription medicines only as told by your doctor. Do not take aspirin, ibuprofen, or other NSAIDs unless your doctor says it is okay.  Wear loose clothes. Do not wear anything tight around your waist.  Raise (elevate) the head of your bed about 6 inches (15 cm).  Avoid bending over if this makes your symptoms worse.  Keep all follow-up visits as told by your doctor. This is important. Contact a doctor if:  You have new symptoms.  You lose weight and you do not know why.  You have trouble swallowing or it hurts to swallow.  You have wheezing or a cough that keeps happening.  Your symptoms do not get better with treatment.  You have a hoarse voice. Get help right away if:  You have pain in your arms, neck, jaw, teeth, or back.  You feel sweaty, dizzy, or light-headed.  You have chest pain or shortness of breath.  You throw up (vomit) and your throw-up looks like blood or coffee grounds.  You pass out (faint).  Your poop (stool) is bloody or black.  You cannot swallow, drink, or eat. Summary  If a person has gastroesophageal reflux disease (GERD), food and stomach acid move back up into the esophagus and cause symptoms or problems such as damage to the esophagus.  Treatment will depend on how bad your symptoms are.  Follow a diet as told by your doctor.  Take all medicines only as told by your doctor. This information is not intended to replace advice given to you by your health care provider. Make sure you discuss any questions you have with your health care provider. Document Released: 07/18/2007 Document Revised: 08/07/2017 Document Reviewed: 08/07/2017 Elsevier Patient Education  2020 [employer].
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
## 2018-12-18 ENCOUNTER — Encounter: Payer: Self-pay | Admitting: Family Medicine

## 2018-12-18 LAB — H. PYLORI BREATH TEST: H. pylori Breath Test: NOT DETECTED

## 2018-12-22 ENCOUNTER — Other Ambulatory Visit: Payer: Self-pay | Admitting: Obstetrics & Gynecology

## 2018-12-22 ENCOUNTER — Telehealth: Payer: Self-pay | Admitting: Family Medicine

## 2018-12-22 DIAGNOSIS — N952 Postmenopausal atrophic vaginitis: Secondary | ICD-10-CM

## 2018-12-22 NOTE — Telephone Encounter (Signed)
See note

## 2018-12-22 NOTE — Telephone Encounter (Signed)
Medication refill request: Estradiol  Last AEX:  03-06-2018 DL  Next AEX: 03-11-19 Last MMG (if hormonal medication request): 12-23-17 density C/BIRADS 1 negative  Refill authorized: Today, please advise.   Medication pended for #12, 0RF. Please refill if appropriate.

## 2018-12-22 NOTE — Telephone Encounter (Signed)
Pharmacy called in with questions regarding patients current medication : sertraline (ZOLOFT) 50 MG tablet [[id number]]    Please call [place], [place] - [address]  [address] [place] [address]  Phone: [phone] Fax: [phone]

## 2018-12-23 MED ORDER — SERTRALINE HCL 50 MG PO TABS: ORAL_TABLET | ORAL | 3 refills | Status: DC

## 2018-12-23 NOTE — Telephone Encounter (Signed)
Called and spoke to pt told her I called [facility] in [facility] and they do not have a Rx for Zoloft for you. Asked pt if she needs the Rx yet? Pt said yes, called the wrong pharmacy I use [facility] in [place]. Told pt okay I will call there and get Rx straightened out so you can pick it up later. Pt verbalized understanding.

## 2018-12-23 NOTE — Addendum Note (Signed)
Addended by: Marian Sorrow on: 12/23/2018 10:36 AM   Modules accepted: Orders

## 2018-12-23 NOTE — Telephone Encounter (Signed)
Called pharmacy they do not even have a Rx for the pt. Will call pt to see what is needed.

## 2018-12-23 NOTE — Telephone Encounter (Signed)
[address] and spoke to [place] told her someone called about Rx Zoloft. Jaclyn Shaggy said just needed clarification on how often to take. Told Colleen pt to take 1/2 tablet daily 1 to  2 weeks and then increase to one tablet daily. Colleen verbalized understanding.

## 2019-01-22 ENCOUNTER — Ambulatory Visit: Payer: 59 | Admitting: Family Medicine

## 2019-03-11 ENCOUNTER — Ambulatory Visit: Payer: 59 | Admitting: Certified Nurse Midwife

## 2019-04-06 ENCOUNTER — Other Ambulatory Visit: Payer: Self-pay | Admitting: Certified Nurse Midwife

## 2019-04-06 DIAGNOSIS — N952 Postmenopausal atrophic vaginitis: Secondary | ICD-10-CM

## 2019-04-07 NOTE — Telephone Encounter (Signed)
Medication refill request: Estradiol tab  Last AEX:  03/06/18  Next AEX: nothing scheduled note attached to pharmacy for patient to schedule annual.  Last MMG (if hormonal medication request): 12/23/17 Bi-rads 1 neg  Refill authorized: #8 with 0 RF

## 2019-05-04 ENCOUNTER — Encounter: Payer: Self-pay | Admitting: Certified Nurse Midwife

## 2019-05-05 ENCOUNTER — Other Ambulatory Visit: Payer: Self-pay | Admitting: Certified Nurse Midwife

## 2019-05-05 DIAGNOSIS — N952 Postmenopausal atrophic vaginitis: Secondary | ICD-10-CM

## 2019-05-05 NOTE — Telephone Encounter (Signed)
Medication refill request: Estradiol  Last AEX:  03-06-2018 DL  Next AEX: 07-07-19 Last MMG (if hormonal medication request): 12-23-17 density C/BIRADS 1 negative  Refill authorized: Today, please advise.   Call to patient. Patient states she would like to schedule her aex in May due to concerns with Covid. Patient scheduled for 07-07-19 at 1500 with Dr. Sabra Heck. Patient agreeable to date and time of appointment. Patient also states she has not had her MMG yet this year due to Covid. Aware is due- would like to discuss with Dr. Sabra Heck at appointment. RN advised would send request to Dr. Sabra Heck to review and advise. Patient agreeable. Pharmacy confirmed.   Medication pended for #8, 1RF. Please refill if appropriate.
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
## 2019-07-07 ENCOUNTER — Ambulatory Visit: Payer: 59 | Admitting: Obstetrics & Gynecology
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
## 2019-07-22 ENCOUNTER — Other Ambulatory Visit: Payer: Self-pay

## 2019-07-23 ENCOUNTER — Encounter: Payer: Self-pay | Admitting: Obstetrics & Gynecology

## 2019-07-23 ENCOUNTER — Ambulatory Visit (INDEPENDENT_AMBULATORY_CARE_PROVIDER_SITE_OTHER): Payer: 59 | Admitting: Obstetrics & Gynecology

## 2019-07-23 ENCOUNTER — Other Ambulatory Visit: Payer: Self-pay

## 2019-07-23 VITALS — BP 104/74 | HR 70 | Temp 97.0°F | Resp 16 | Ht 61.25 in | Wt 131.0 lb

## 2019-07-23 DIAGNOSIS — N952 Postmenopausal atrophic vaginitis: Secondary | ICD-10-CM | POA: Diagnosis not present

## 2019-07-23 DIAGNOSIS — Z01419 Encounter for gynecological examination (general) (routine) without abnormal findings: Secondary | ICD-10-CM | POA: Diagnosis not present

## 2019-07-23 MED ORDER — NONFORMULARY OR COMPOUNDED ITEM: 3 refills | Status: DC

## 2019-07-23 MED ORDER — ESTRADIOL 10 MCG VA TABS: 1.0000 | ORAL_TABLET | VAGINAL | 4 refills | Status: DC

## 2019-07-23 MED ORDER — VALACYCLOVIR HCL 500 MG PO TABS: ORAL_TABLET | ORAL | 4 refills | Status: DC

## 2019-07-23 MED ORDER — GABAPENTIN 100 MG PO CAPS: ORAL_CAPSULE | ORAL | 3 refills | Status: DC

## 2019-07-30 ENCOUNTER — Other Ambulatory Visit: Payer: Self-pay | Admitting: Family Medicine

## 2019-10-05 ENCOUNTER — Ambulatory Visit (INDEPENDENT_AMBULATORY_CARE_PROVIDER_SITE_OTHER): Payer: 59 | Admitting: Family Medicine

## 2019-10-05 ENCOUNTER — Other Ambulatory Visit: Payer: Self-pay

## 2019-10-05 ENCOUNTER — Encounter: Payer: Self-pay | Admitting: Family Medicine

## 2019-10-05 VITALS — BP 140/82 | HR 83 | Temp 97.7°F | Ht 61.0 in | Wt 136.8 lb

## 2019-10-05 DIAGNOSIS — I2510 Atherosclerotic heart disease of native coronary artery without angina pectoris: Secondary | ICD-10-CM

## 2019-10-05 DIAGNOSIS — Z0000 Encounter for general adult medical examination without abnormal findings: Secondary | ICD-10-CM | POA: Diagnosis not present

## 2019-10-05 DIAGNOSIS — R030 Elevated blood-pressure reading, without diagnosis of hypertension: Secondary | ICD-10-CM

## 2019-10-05 DIAGNOSIS — Z1159 Encounter for screening for other viral diseases: Secondary | ICD-10-CM | POA: Diagnosis not present

## 2019-10-05 DIAGNOSIS — Z114 Encounter for screening for human immunodeficiency virus [HIV]: Secondary | ICD-10-CM

## 2019-10-05 DIAGNOSIS — F321 Major depressive disorder, single episode, moderate: Secondary | ICD-10-CM

## 2019-10-05 DIAGNOSIS — L309 Dermatitis, unspecified: Secondary | ICD-10-CM

## 2019-10-05 MED ORDER — TRIAMCINOLONE ACETONIDE 0.1 % EX CREA: 1.0000 "application " | TOPICAL_CREAM | Freq: Two times a day (BID) | CUTANEOUS | 0 refills | Status: DC
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
## 2019-10-05 NOTE — Patient Instructions (Addendum)
-they will call you to get cardiac testing done.  -check tetanus shot for me.   -for prophylaxis for covid 1) vit. D3: 1000-3000IU/day 2) vit C: 500-1057m twice a day 3) querectin: 250-50660mdaily 4) melatonin: 60m27mt night-DROWSY 5) mouth wash: act/crest/scope BID  Would get pulse oximeter to have at home as well.    Preventive Care 40-59 54ars Old, Female Preventive care refers to visits with your health care provider and lifestyle choices that can promote health and wellness. This includes:  A yearly physical exam. This may also be called an annual well check.  Regular dental visits and eye exams.  Immunizations.  Screening for certain conditions.  Healthy lifestyle choices, such as eating a healthy diet, getting regular exercise, not using drugs or products that contain nicotine and tobacco, and limiting alcohol use. What can I expect for my preventive care visit? Physical exam Your health care provider will check your:  Height and weight. This may be used to calculate body mass index (BMI), which tells if you are at a healthy weight.  Heart rate and blood pressure.  Skin for abnormal spots. Counseling Your health care provider may ask you questions about your:  Alcohol, tobacco, and drug use.  Emotional well-being.  Home and relationship well-being.  Sexual activity.  Eating habits.  Work and work envStatisticianMethod of birth control.  Menstrual cycle.  Pregnancy history. What immunizations do I need?  Influenza (flu) vaccine  This is recommended every year. Tetanus, diphtheria, and pertussis (Tdap) vaccine  You may need a Td booster every 10 years. Varicella (chickenpox) vaccine  You may need this if you have not been vaccinated. Zoster (shingles) vaccine  You may need this after age 70.59easles, mumps, and rubella (MMR) vaccine  You may need at least one dose of MMR if you were born in 1957 or later. You may also need a second  dose. Pneumococcal conjugate (PCV13) vaccine  You may need this if you have certain conditions and were not previously vaccinated. Pneumococcal polysaccharide (PPSV23) vaccine  You may need one or two doses if you smoke cigarettes or if you have certain conditions. Meningococcal conjugate (MenACWY) vaccine  You may need this if you have certain conditions. Hepatitis A vaccine  You may need this if you have certain conditions or if you travel or work in places where you may be exposed to hepatitis A. Hepatitis B vaccine  You may need this if you have certain conditions or if you travel or work in places where you may be exposed to hepatitis B. Haemophilus influenzae type b (Hib) vaccine  You may need this if you have certain conditions. Human papillomavirus (HPV) vaccine  If recommended by your health care provider, you may need three doses over 6 months. You may receive vaccines as individual doses or as more than one vaccine together in one shot (combination vaccines). Talk with your health care provider about the risks and benefits of combination vaccines. What tests do I need? Blood tests  Lipid and cholesterol levels. These may be checked every 5 years, or more frequently if you are over 50 54ars old.  Hepatitis C test.  Hepatitis B test. Screening  Lung cancer screening. You may have this screening every year starting at age 37 11 you have a 30-pack-year history of smoking and currently smoke or have quit within the past 15 years.  Colorectal cancer screening. All adults should have this screening starting at age 71 34d continuing until  age 40. Your health care provider may recommend screening at age 72 if you are at increased risk. You will have tests every 1-10 years, depending on your results and the type of screening test.  Diabetes screening. This is done by checking your blood sugar (glucose) after you have not eaten for a while (fasting). You may have this done every  1-3 years.  Mammogram. This may be done every 1-2 years. Talk with your health care provider about when you should start having regular mammograms. This may depend on whether you have a family history of breast cancer.  BRCA-related cancer screening. This may be done if you have a family history of breast, ovarian, tubal, or peritoneal cancers.  Pelvic exam and Pap test. This may be done every 3 years starting at age 66. Starting at age 108, this may be done every 5 years if you have a Pap test in combination with an HPV test. Other tests  Sexually transmitted disease (STD) testing.  Bone density scan. This is done to screen for osteoporosis. You may have this scan if you are at high risk for osteoporosis. Follow these instructions at home: Eating and drinking  Eat a diet that includes fresh fruits and vegetables, whole grains, lean protein, and low-fat dairy.  Take vitamin and mineral supplements as recommended by your health care provider.  Do not drink alcohol if: ? Your health care provider tells you not to drink. ? You are pregnant, may be pregnant, or are planning to become pregnant.  If you drink alcohol: ? Limit how much you have to 0-1 drink a day. ? Be aware of how much alcohol is in your drink. In the [place] one drink equals one 12 oz bottle of beer (355 mL), one 5 oz glass of wine (148 mL), or one 1 oz glass of hard liquor (44 mL). Lifestyle  Take daily care of your teeth and gums.  Stay active. Exercise for at least 30 minutes on 5 or more days each week.  Do not use any products that contain nicotine or tobacco, such as cigarettes, e-cigarettes, and chewing tobacco. If you need help quitting, ask your health care provider.  If you are sexually active, practice safe sex. Use a condom or other form of birth control (contraception) in order to prevent pregnancy and STIs (sexually transmitted infections).  If told by your health care provider, take low-dose aspirin daily  starting at age 19. What's next?  Visit your health care provider once a year for a well check visit.  Ask your health care provider how often you should have your eyes and teeth checked.  Stay up to date on all vaccines. This information is not intended to replace advice given to you by your health care provider. Make sure you discuss any questions you have with your health care provider. Document Revised: 10/10/2017 Document Reviewed: 10/10/2017 Elsevier Patient Education  2020 [employer].

## 2019-10-06 LAB — CBC WITH DIFFERENTIAL/PLATELET
Absolute Monocytes: 459 {cells}/uL (ref 200–950)
Basophils Absolute: 41 {cells}/uL (ref 0–200)
Basophils Relative: 0.9 %
Eosinophils Absolute: 90 {cells}/uL (ref 15–500)
Eosinophils Relative: 2 %
HCT: 40.8 % (ref 35.0–45.0)
Hemoglobin: 13.8 g/dL (ref 11.7–15.5)
Lymphs Abs: 1994 {cells}/uL (ref 850–3900)
MCH: 31.3 pg (ref 27.0–33.0)
MCHC: 33.8 g/dL (ref 32.0–36.0)
MCV: 92.5 fL (ref 80.0–100.0)
MPV: 8.8 fL (ref 7.5–12.5)
Monocytes Relative: 10.2 %
Neutro Abs: 1917 {cells}/uL (ref 1500–7800)
Neutrophils Relative %: 42.6 %
Platelets: 376 Thousand/uL (ref 140–400)
RBC: 4.41 Million/uL (ref 3.80–5.10)
RDW: 12 % (ref 11.0–15.0)
Total Lymphocyte: 44.3 %
WBC: 4.5 Thousand/uL (ref 3.8–10.8)

## 2019-10-06 LAB — LIPID PANEL
Cholesterol: 202 mg/dL — ABNORMAL HIGH (ref <200)
HDL: 61 mg/dL (ref >=50)
LDL Cholesterol (Calc): 120 mg/dL (calc) — ABNORMAL HIGH
Non-HDL Cholesterol (Calc): 141 mg/dL (calc) — ABNORMAL HIGH (ref <130)
Total CHOL/HDL Ratio: 3.3 (calc) (ref <5.0)
Triglycerides: 106 mg/dL (ref <150)

## 2019-10-06 LAB — HIV ANTIBODY (ROUTINE TESTING W REFLEX): HIV 1&2 Ab, 4th Generation: NONREACTIVE

## 2019-10-06 LAB — COMPLETE METABOLIC PANEL WITHOUT GFR
AG Ratio: 1.7 (calc) (ref 1.0–2.5)
ALT: 14 U/L (ref 6–29)
AST: 16 U/L (ref 10–35)
Albumin: 4.7 g/dL (ref 3.6–5.1)
Alkaline phosphatase (APISO): 68 U/L (ref 37–153)
BUN: 14 mg/dL (ref 7–25)
CO2: 27 mmol/L (ref 20–32)
Calcium: 9.8 mg/dL (ref 8.6–10.4)
Chloride: 98 mmol/L (ref 98–110)
Creat: 0.61 mg/dL (ref 0.50–1.05)
GFR, Est African American: 119 mL/min/1.73m2
GFR, Est Non African American: 103 mL/min/1.73m2
Globulin: 2.7 g/dL (ref 1.9–3.7)
Glucose, Bld: 93 mg/dL (ref 65–99)
Potassium: 4.7 mmol/L (ref 3.5–5.3)
Sodium: 134 mmol/L — ABNORMAL LOW (ref 135–146)
Total Bilirubin: 0.6 mg/dL (ref 0.2–1.2)
Total Protein: 7.4 g/dL (ref 6.1–8.1)

## 2019-10-06 LAB — HEPATITIS C ANTIBODY
Hepatitis C Ab: NONREACTIVE
SIGNAL TO CUT-OFF: 0.01

## 2019-10-06 LAB — TSH: TSH: 2.21 m[IU]/L

## 2019-10-26 ENCOUNTER — Other Ambulatory Visit: Payer: Self-pay

## 2019-10-26 ENCOUNTER — Encounter: Payer: Self-pay | Admitting: Family Medicine

## 2019-10-26 ENCOUNTER — Ambulatory Visit (INDEPENDENT_AMBULATORY_CARE_PROVIDER_SITE_OTHER): Payer: 59 | Admitting: Family Medicine

## 2019-10-26 VITALS — BP 112/74 | HR 89 | Temp 98.7°F | Ht 61.0 in | Wt 137.0 lb

## 2019-10-26 DIAGNOSIS — R635 Abnormal weight gain: Secondary | ICD-10-CM | POA: Diagnosis not present

## 2019-10-26 DIAGNOSIS — K219 Gastro-esophageal reflux disease without esophagitis: Secondary | ICD-10-CM

## 2019-10-26 DIAGNOSIS — Z7189 Other specified counseling: Secondary | ICD-10-CM

## 2019-10-26 DIAGNOSIS — Z7185 Encounter for immunization safety counseling: Secondary | ICD-10-CM

## 2019-10-26 MED ORDER — PANTOPRAZOLE SODIUM 40 MG PO TBEC: 40.0000 mg | DELAYED_RELEASE_TABLET | Freq: Two times a day (BID) | ORAL | 1 refills | Status: DC

## 2019-10-26 NOTE — Patient Instructions (Addendum)
protonix twice a day. Let me know after 2 weeks and if not better we will send to GI.   No medical reason to exempt you from vaccine.   Call me if you get covid, as I treat it.   I LOVE YOUR HOUSE!!!   Aw
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
## 2019-10-28 ENCOUNTER — Other Ambulatory Visit: Payer: Self-pay | Admitting: Obstetrics & Gynecology

## 2019-10-28 ENCOUNTER — Telehealth: Payer: Self-pay | Admitting: Obstetrics & Gynecology

## 2019-10-28 DIAGNOSIS — N952 Postmenopausal atrophic vaginitis: Secondary | ICD-10-CM

## 2019-10-28 NOTE — Telephone Encounter (Signed)
Call to [facility], spoke with Christene Slates, who confirmed prescriptions for valtrex and estradiol were not received by pharmacy on 07-23-19. Verbals orders given per review of Dr. Ammie Ferrier orders on 07-23-19:  Valtrex: Sig: Take 1 tab daily. With symptoms increase to twice daily x 3, #90, 3RF.  Estradiol: Sig: Place 1 tablet (10 mcg total) vaginally 2 (two) times a week. #24, 3RF.   Returned call to patient and updated prescriptions sent to pharmacy. Patient appreciative of phone call.  Routing to provider and will close encounter.

## 2019-10-28 NOTE — Telephone Encounter (Signed)
Call to patient. Patient states she is completely out of valtrex and estradiol. States she contacted [facility] and was told there were no remaining refills and her prescription request was denied. RN advised would contact pharmacy.

## 2019-10-28 NOTE — Telephone Encounter (Signed)
Patient requesting refills on valacyclovir and estradiol. [facility] at [phone]

## 2020-03-02 ENCOUNTER — Telehealth (INDEPENDENT_AMBULATORY_CARE_PROVIDER_SITE_OTHER): Payer: 59 | Admitting: Family Medicine

## 2020-03-02 ENCOUNTER — Encounter: Payer: Self-pay | Admitting: Family Medicine

## 2020-03-02 ENCOUNTER — Other Ambulatory Visit: Payer: Self-pay

## 2020-03-02 VITALS — Ht 61.0 in | Wt 136.9 lb

## 2020-03-02 DIAGNOSIS — U071 COVID-19: Secondary | ICD-10-CM | POA: Diagnosis not present
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
## 2020-03-02 NOTE — Patient Instructions (Addendum)
Start these vitamins (or doses) WITH COVID  1) vitamin D3: 5000IU/day 2) zinc: 100mg /day 3) vitamin C: 1000mg  three times a day 4) quercetin 500mg  twice a day 5) melatonin 10mg /night  6) baby aspirin  Tylenol only for fever/aches/headache.   Take the above for 10-15 days or until better.   Mouthwash three times a day (listerine, scope, acts)  1% iodine drop to nose once/day.   Pulse ox: 93-94% or higher.   [phone] call me if not doing well.     For daily prophylaxis without covid 1) vitamin D3: 2000IU/day 2) zinc 25mg /day 3) vitamin C: 500mg /day 4) quercetin: 250mg /day 5) melatonin: 5mg /day

## 2020-03-17 ENCOUNTER — Ambulatory Visit (INDEPENDENT_AMBULATORY_CARE_PROVIDER_SITE_OTHER): Payer: 59 | Admitting: Family Medicine

## 2020-03-17 ENCOUNTER — Encounter: Payer: Self-pay | Admitting: Family Medicine

## 2020-03-17 ENCOUNTER — Other Ambulatory Visit: Payer: Self-pay

## 2020-03-17 ENCOUNTER — Other Ambulatory Visit: Payer: Self-pay | Admitting: Family Medicine

## 2020-03-17 VITALS — BP 102/60 | HR 78 | Temp 98.1°F | Ht 61.0 in | Wt 128.6 lb

## 2020-03-17 DIAGNOSIS — I2510 Atherosclerotic heart disease of native coronary artery without angina pectoris: Secondary | ICD-10-CM | POA: Diagnosis not present

## 2020-03-17 DIAGNOSIS — L821 Other seborrheic keratosis: Secondary | ICD-10-CM | POA: Diagnosis not present

## 2020-03-17 DIAGNOSIS — M810 Age-related osteoporosis without current pathological fracture: Secondary | ICD-10-CM
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
## 2020-03-17 NOTE — Patient Instructions (Signed)
-  ordered DEXA, they will call to set up.   ordered calcium score of your heart. Its looks for plaque build up.    Due for labs in august!   Skin thing is called a seborrheic keratosis   Seborrheic Keratosis A seborrheic keratosis is a common, noncancerous (benign) skin growth. These growths are velvety, waxy, rough, tan, brown, or black spots that appear on the skin. These skin growths can be flat or raised, and scaly. What are the causes? The cause of this condition is not known. What increases the risk? You are more likely to develop this condition if you:  Have a family history of seborrheic keratosis.  Are 50 or older.  Are pregnant.  Have had estrogen replacement therapy. What are the signs or symptoms? Symptoms of this condition include growths on the face, chest, shoulders, back, or other areas. These growths:  Are usually painless, but may become irritated and itchy.  Can be yellow, brown, black, or other colors.  Are slightly raised or have a flat surface.  Are sometimes rough or wart-like in texture.  Are often velvety or waxy on the surface.  Are round or oval-shaped.  Often occur in groups, but may occur as a single growth.   How is this diagnosed? This condition is diagnosed with a medical history and physical exam.  A sample of the growth may be tested (skin biopsy).  You may need to see a skin specialist (dermatologist). How is this treated? Treatment is not usually needed for this condition, unless the growths are irritated or bleed often.  You may also choose to have the growths removed if you do not like their appearance. ? Most commonly, these growths are treated with a procedure in which liquid nitrogen is applied to "freeze" off the growth (cryosurgery). ? They may also be burned off with electricity (electrocautery) or removed by scraping (curettage). Follow these instructions at home:  Watch your growth for any changes.  Keep all  follow-up visits as told by your health care provider. This is important.  Do not scratch or pick at the growth or growths. This can cause them to become irritated or infected. Contact a health care provider if:  You suddenly have many new growths.  Your growth bleeds, itches, or hurts.  Your growth suddenly becomes larger or changes color. Summary  A seborrheic keratosis is a common, noncancerous (benign) skin growth.  Treatment is not usually needed for this condition, unless the growths are irritated or bleed often.  Watch your growth for any changes.  Contact a health care provider if you suddenly have many new growths or your growth suddenly becomes larger or changes color.  Keep all follow-up visits as told by your health care provider. This is important. This information is not intended to replace advice given to you by your health care provider. Make sure you discuss any questions you have with your health care provider. Document Revised: 06/13/2017 Document Reviewed: 06/13/2017 Elsevier Patient Education  2021 [employer].

## 2020-03-22 ENCOUNTER — Other Ambulatory Visit: Payer: 59

## 2020-03-23 ENCOUNTER — Other Ambulatory Visit (HOSPITAL_BASED_OUTPATIENT_CLINIC_OR_DEPARTMENT_OTHER): Payer: Self-pay | Admitting: Family Medicine

## 2020-03-23 DIAGNOSIS — I2510 Atherosclerotic heart disease of native coronary artery without angina pectoris: Secondary | ICD-10-CM

## 2020-03-24 ENCOUNTER — Other Ambulatory Visit (HOSPITAL_BASED_OUTPATIENT_CLINIC_OR_DEPARTMENT_OTHER): Payer: 59

## 2020-03-28 ENCOUNTER — Other Ambulatory Visit (HOSPITAL_BASED_OUTPATIENT_CLINIC_OR_DEPARTMENT_OTHER): Payer: 59

## 2020-03-30 ENCOUNTER — Other Ambulatory Visit (HOSPITAL_BASED_OUTPATIENT_CLINIC_OR_DEPARTMENT_OTHER): Payer: 59

## 2020-03-30 ENCOUNTER — Ambulatory Visit (HOSPITAL_BASED_OUTPATIENT_CLINIC_OR_DEPARTMENT_OTHER): Payer: 59

## 2020-04-06 ENCOUNTER — Ambulatory Visit (HOSPITAL_BASED_OUTPATIENT_CLINIC_OR_DEPARTMENT_OTHER)
Admission: RE | Admit: 2020-04-06 | Discharge: 2020-04-06 | Disposition: A | Discharge status: Home / Self Care | Payer: 59 | Source: Ambulatory Visit | Attending: Family Medicine | Admitting: Family Medicine

## 2020-04-06 ENCOUNTER — Encounter: Payer: Self-pay | Admitting: Family Medicine

## 2020-04-06 ENCOUNTER — Other Ambulatory Visit: Payer: Self-pay

## 2020-04-06 DIAGNOSIS — I2510 Atherosclerotic heart disease of native coronary artery without angina pectoris: Secondary | ICD-10-CM

## 2020-04-06 DIAGNOSIS — M810 Age-related osteoporosis without current pathological fracture: Secondary | ICD-10-CM

## 2020-04-08 ENCOUNTER — Inpatient Hospital Stay: Admission: RE | Admit: 2020-04-08 | Payer: 59 | Source: Ambulatory Visit

## 2020-04-08 ENCOUNTER — Other Ambulatory Visit: Payer: 59

## 2020-04-22 ENCOUNTER — Other Ambulatory Visit (HOSPITAL_BASED_OUTPATIENT_CLINIC_OR_DEPARTMENT_OTHER): Payer: Self-pay | Admitting: Obstetrics & Gynecology

## 2020-06-20 ENCOUNTER — Ambulatory Visit: Payer: 59 | Admitting: Family Medicine

## 2020-06-23 ENCOUNTER — Ambulatory Visit: Payer: 59 | Admitting: Family Medicine

## 2020-06-29 ENCOUNTER — Other Ambulatory Visit: Payer: Self-pay

## 2020-06-29 ENCOUNTER — Encounter: Payer: Self-pay | Admitting: Family Medicine

## 2020-06-29 ENCOUNTER — Ambulatory Visit (INDEPENDENT_AMBULATORY_CARE_PROVIDER_SITE_OTHER): Payer: 59 | Admitting: Family Medicine

## 2020-06-29 VITALS — BP 120/80 | HR 81 | Temp 98.0°F | Ht 61.0 in | Wt 124.4 lb

## 2020-06-29 DIAGNOSIS — L821 Other seborrheic keratosis: Secondary | ICD-10-CM

## 2020-06-29 MED ORDER — PANTOPRAZOLE SODIUM 40 MG PO TBEC: 40.0000 mg | DELAYED_RELEASE_TABLET | Freq: Every day | ORAL | 3 refills | Status: DC

## 2020-06-29 MED ORDER — VALACYCLOVIR HCL 500 MG PO TABS: ORAL_TABLET | ORAL | 4 refills | Status: DC
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
## 2020-06-29 NOTE — Patient Instructions (Signed)
cryotherapy today on lesion. Wound care given for you.   covid protocol for only if you get covid 1) zinc 100mg /day 2) vitamin D3: 5000IU/day 3) quercetin 500mg  twice a day 4) vitamin C 1000mg  2-3x/day 5) melatonin 10mg /nightly  -mouthwash three times day  -1% iodine nasal wash daily  -pulse ox >93-94%  Prevention 1) vitamin D3: 2000iu/day 2) quercetin: 250mg /day 3) vitamin C: 500mg /day 4) zinc: 25mg /day 5) melatonin 5mg /night    Seborrheic Keratosis A seborrheic keratosis is a common, noncancerous (benign) skin growth. These growths are velvety, waxy, rough, tan, brown, or black spots that appear on the skin. These skin growths can be flat or raised, and scaly. What are the causes? The cause of this condition is not known. What increases the risk? You are more likely to develop this condition if you:  Have a family history of seborrheic keratosis.  Are 50 or older.  Are pregnant.  Have had estrogen replacement therapy. What are the signs or symptoms? Symptoms of this condition include growths on the face, chest, shoulders, back, or other areas. These growths:  Are usually painless, but may become irritated and itchy.  Can be yellow, brown, black, or other colors.  Are slightly raised or have a flat surface.  Are sometimes rough or wart-like in texture.  Are often velvety or waxy on the surface.  Are round or oval-shaped.  Often occur in groups, but may occur as a single growth.   How is this diagnosed? This condition is diagnosed with a medical history and physical exam.  A sample of the growth may be tested (skin biopsy).  You may need to see a skin specialist (dermatologist). How is this treated? Treatment is not usually needed for this condition, unless the growths are irritated or bleed often.  You may also choose to have the growths removed if you do not like their appearance. ? Most commonly, these growths are treated with a procedure in which liquid  nitrogen is applied to "freeze" off the growth (cryosurgery). ? They may also be burned off with electricity (electrocautery) or removed by scraping (curettage). Follow these instructions at home:  Watch your growth for any changes.  Keep all follow-up visits as told by your health care provider. This is important.  Do not scratch or pick at the growth or growths. This can cause them to become irritated or infected. Contact a health care provider if:  You suddenly have many new growths.  Your growth bleeds, itches, or hurts.  Your growth suddenly becomes larger or changes color. Summary  A seborrheic keratosis is a common, noncancerous (benign) skin growth.  Treatment is not usually needed for this condition, unless the growths are irritated or bleed often.  Watch your growth for any changes.  Contact a health care provider if you suddenly have many new growths or your growth suddenly becomes larger or changes color.  Keep all follow-up visits as told by your health care provider. This is important. This information is not intended to replace advice given to you by your health care provider. Make sure you discuss any questions you have with your health care provider. Document Revised: 06/13/2017 Document Reviewed: 06/13/2017 Elsevier Patient Education  2021 [employer].

## 2020-07-21 IMAGING — DX CHEST - 2 VIEW
2 series · 2 of 2 positions shown · non-contrast
Comparison: None.

CLINICAL DATA: Shortness of breath

EXAM:
CHEST - 2 VIEW

[chest pa]
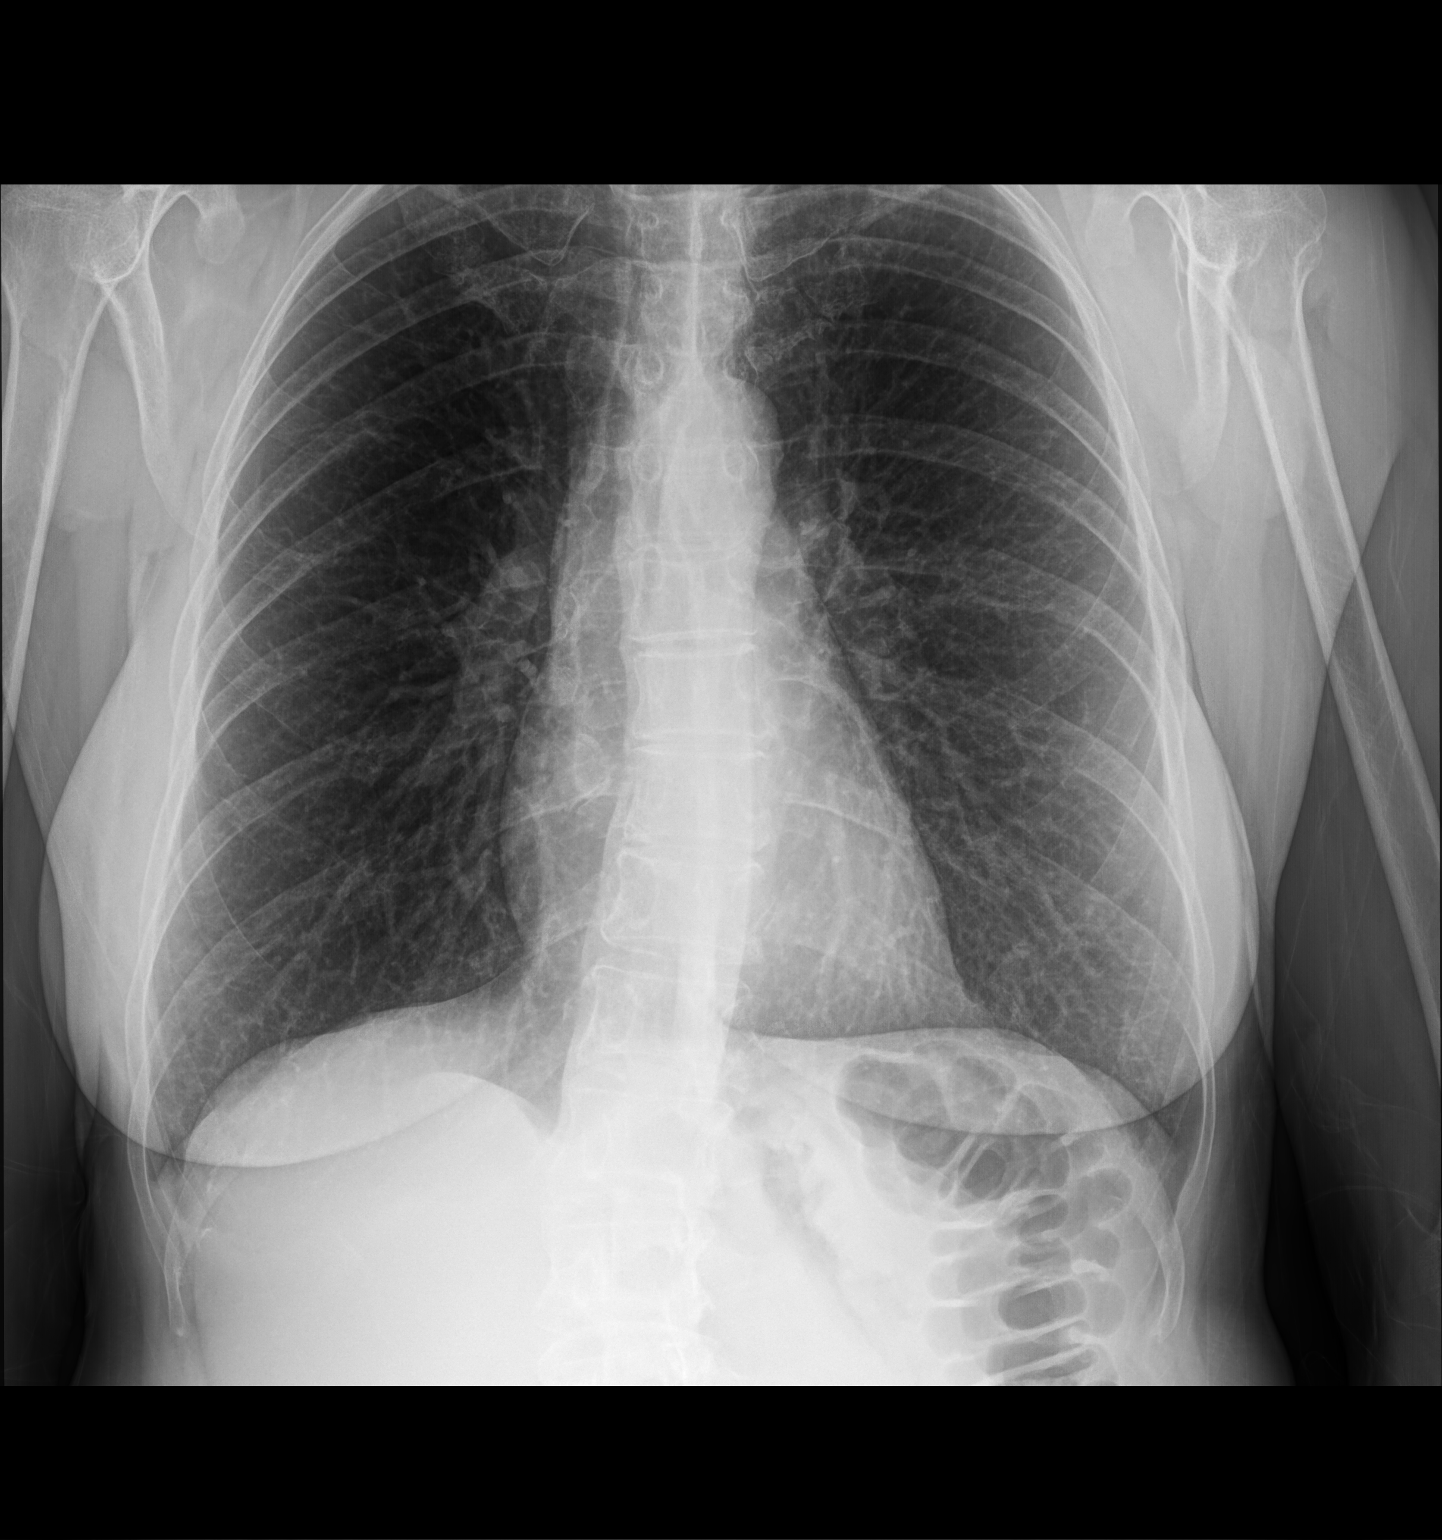

[chest lat]
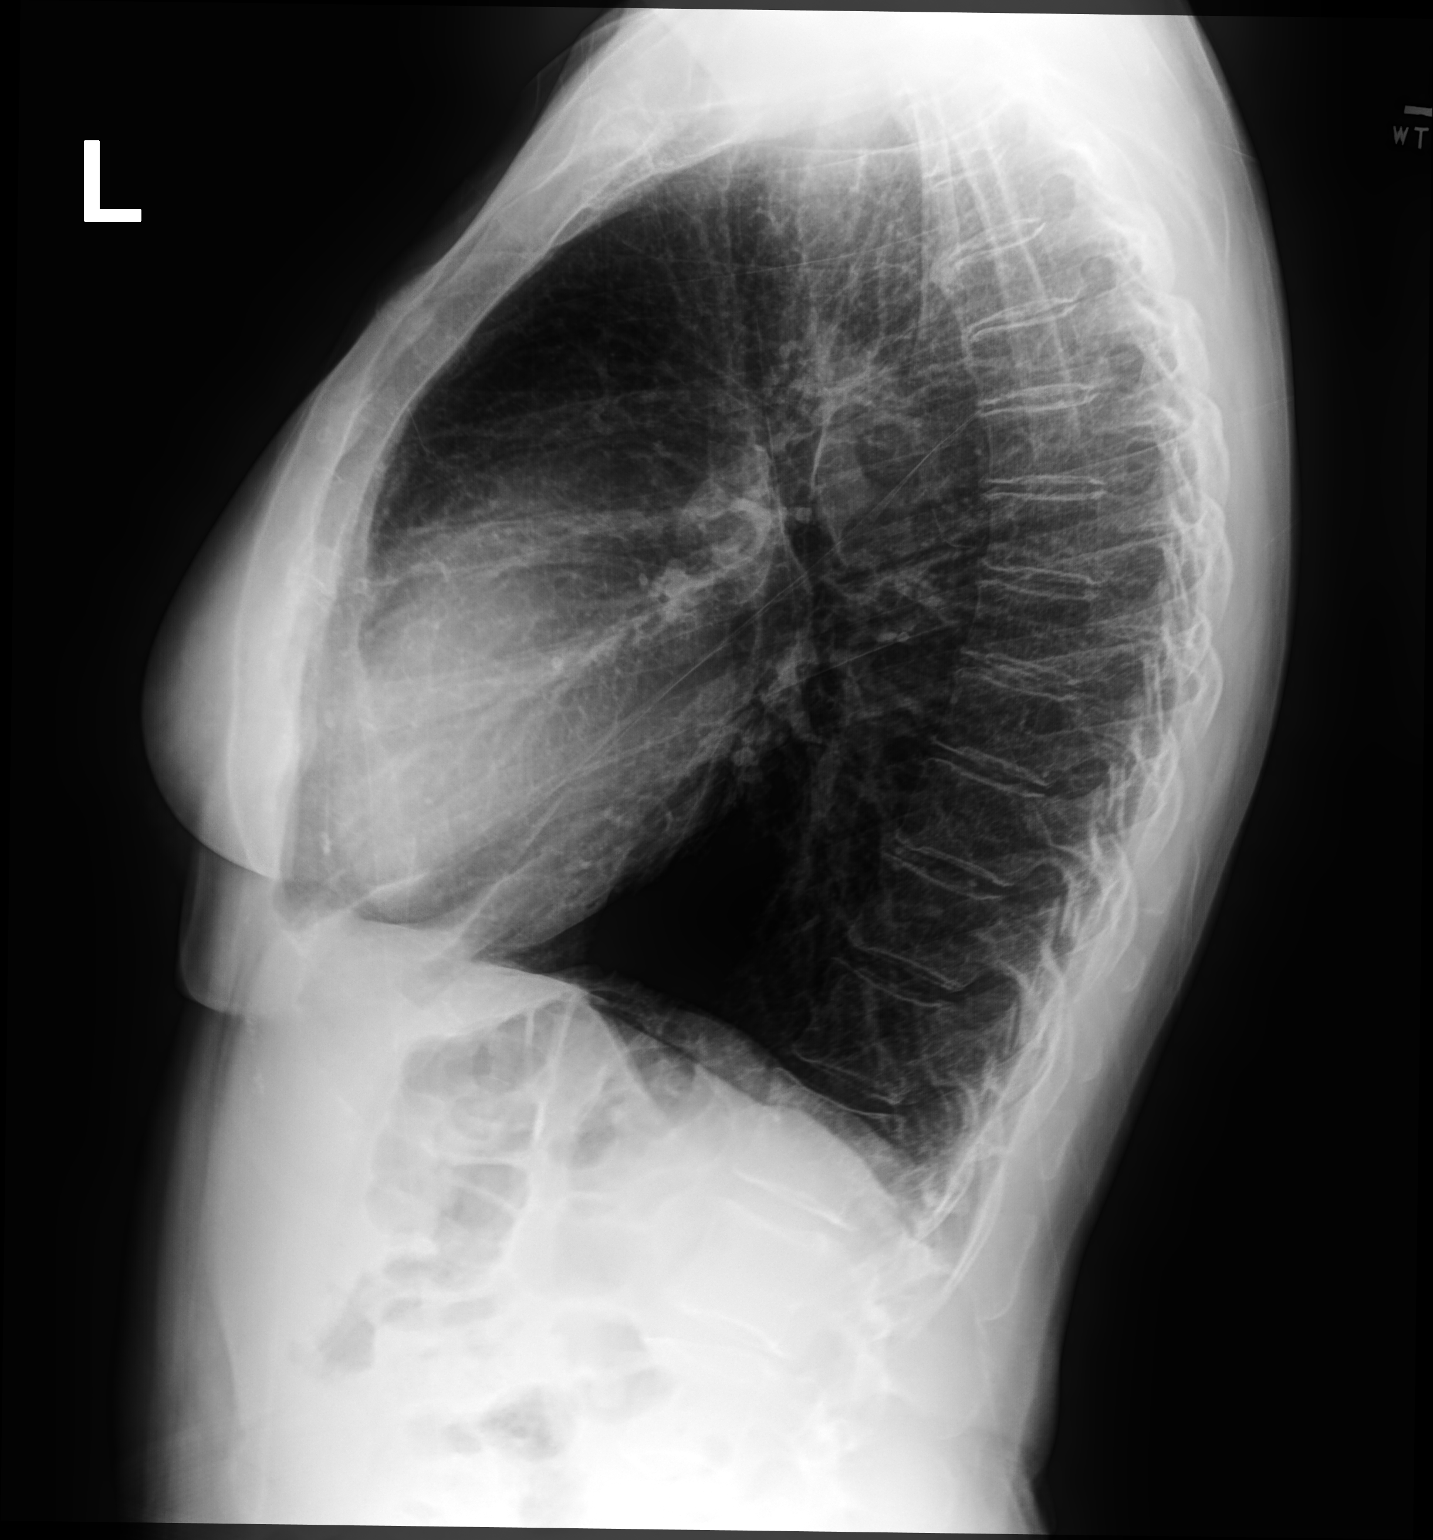

[2 of 2 positions shown; findings below may reference images not displayed]

FINDINGS: The heart size and mediastinal contours are within normal limits.
Mild pulmonary hyperinflation and fine, diffuse interstitial
pulmonary opacity throughout. The visualized skeletal structures are
unremarkable.
IMPRESSION: Mild pulmonary hyperinflation and fine, diffuse interstitial
pulmonary opacity throughout. There may be mild edema,
atypical/viral infection, or chronic underlying interstitial change.
There is no focal airspace opacity.

## 2020-07-26 ENCOUNTER — Ambulatory Visit (INDEPENDENT_AMBULATORY_CARE_PROVIDER_SITE_OTHER): Payer: 59 | Admitting: Obstetrics & Gynecology

## 2020-07-26 ENCOUNTER — Other Ambulatory Visit (HOSPITAL_BASED_OUTPATIENT_CLINIC_OR_DEPARTMENT_OTHER): Payer: Self-pay | Admitting: Obstetrics & Gynecology

## 2020-07-26 ENCOUNTER — Other Ambulatory Visit (HOSPITAL_COMMUNITY)
Admission: RE | Admit: 2020-07-26 | Discharge: 2020-07-26 | Disposition: A | Discharge status: Home / Self Care | Payer: 59 | Source: Ambulatory Visit | Attending: Obstetrics & Gynecology | Admitting: Obstetrics & Gynecology

## 2020-07-26 ENCOUNTER — Ambulatory Visit (HOSPITAL_BASED_OUTPATIENT_CLINIC_OR_DEPARTMENT_OTHER)
Admission: RE | Admit: 2020-07-26 | Discharge: 2020-07-26 | Disposition: A | Discharge status: Home / Self Care | Payer: 59 | Source: Ambulatory Visit | Attending: Obstetrics & Gynecology | Admitting: Obstetrics & Gynecology

## 2020-07-26 ENCOUNTER — Other Ambulatory Visit: Payer: Self-pay

## 2020-07-26 VITALS — BP 125/80 | HR 99 | Ht 61.0 in | Wt 127.8 lb

## 2020-07-26 DIAGNOSIS — Z1231 Encounter for screening mammogram for malignant neoplasm of breast: Secondary | ICD-10-CM

## 2020-07-26 DIAGNOSIS — M810 Age-related osteoporosis without current pathological fracture: Secondary | ICD-10-CM

## 2020-07-26 DIAGNOSIS — Z01419 Encounter for gynecological examination (general) (routine) without abnormal findings: Secondary | ICD-10-CM | POA: Diagnosis not present

## 2020-07-26 DIAGNOSIS — Z124 Encounter for screening for malignant neoplasm of cervix: Secondary | ICD-10-CM | POA: Insufficient documentation

## 2020-07-26 DIAGNOSIS — Z8742 Personal history of other diseases of the female genital tract: Secondary | ICD-10-CM

## 2020-07-26 DIAGNOSIS — I2510 Atherosclerotic heart disease of native coronary artery without angina pectoris: Secondary | ICD-10-CM

## 2020-07-26 DIAGNOSIS — N952 Postmenopausal atrophic vaginitis: Secondary | ICD-10-CM | POA: Diagnosis not present

## 2020-07-26 DIAGNOSIS — R931 Abnormal findings on diagnostic imaging of heart and coronary circulation: Secondary | ICD-10-CM

## 2020-07-26 DIAGNOSIS — A609 Anogenital herpesviral infection, unspecified: Secondary | ICD-10-CM

## 2020-07-26 MED ORDER — VALACYCLOVIR HCL 1 G PO TABS: ORAL_TABLET | ORAL | 4 refills | Status: DC

## 2020-07-26 MED ORDER — ESTRADIOL 10 MCG VA TABS: 1.0000 | ORAL_TABLET | VAGINAL | 4 refills | Status: DC

## 2020-07-26 MED ORDER — MEDROXYPROGESTERONE ACETATE 10 MG PO TABS: 10.0000 mg | ORAL_TABLET | Freq: Every day | ORAL | 3 refills | Status: DC
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
## 2020-07-26 NOTE — Patient Instructions (Addendum)
Worthy Keeler, primary care at [facility].

## 2020-07-27 LAB — CYTOLOGY - PAP
Adequacy: ABSENT
Comment: NEGATIVE
Diagnosis: NEGATIVE
High risk HPV: NEGATIVE

## 2020-07-28 ENCOUNTER — Encounter (HOSPITAL_BASED_OUTPATIENT_CLINIC_OR_DEPARTMENT_OTHER): Payer: Self-pay | Admitting: Obstetrics & Gynecology

## 2020-07-28 DIAGNOSIS — N952 Postmenopausal atrophic vaginitis: Secondary | ICD-10-CM | POA: Insufficient documentation

## 2020-07-28 DIAGNOSIS — A609 Anogenital herpesviral infection, unspecified: Secondary | ICD-10-CM | POA: Insufficient documentation

## 2020-07-29 ENCOUNTER — Encounter (HOSPITAL_BASED_OUTPATIENT_CLINIC_OR_DEPARTMENT_OTHER): Payer: Self-pay

## 2020-07-29 ENCOUNTER — Encounter (HOSPITAL_BASED_OUTPATIENT_CLINIC_OR_DEPARTMENT_OTHER): Payer: Self-pay | Admitting: Obstetrics & Gynecology

## 2020-08-25 ENCOUNTER — Ambulatory Visit (HOSPITAL_BASED_OUTPATIENT_CLINIC_OR_DEPARTMENT_OTHER): Payer: 59 | Admitting: Obstetrics & Gynecology

## 2020-09-06 ENCOUNTER — Encounter (HOSPITAL_BASED_OUTPATIENT_CLINIC_OR_DEPARTMENT_OTHER): Payer: Self-pay

## 2020-09-16 ENCOUNTER — Ambulatory Visit: Payer: Self-pay

## 2020-10-06 ENCOUNTER — Encounter: Payer: 59 | Admitting: Family Medicine

## 2020-10-10 ENCOUNTER — Ambulatory Visit (INDEPENDENT_AMBULATORY_CARE_PROVIDER_SITE_OTHER): Payer: 59 | Admitting: Cardiology

## 2020-10-10 ENCOUNTER — Other Ambulatory Visit: Payer: Self-pay

## 2020-10-10 ENCOUNTER — Encounter (HOSPITAL_BASED_OUTPATIENT_CLINIC_OR_DEPARTMENT_OTHER): Payer: Self-pay | Admitting: Cardiology

## 2020-10-10 VITALS — BP 102/78 | HR 65 | Ht 62.0 in | Wt 131.8 lb

## 2020-10-10 DIAGNOSIS — Z7189 Other specified counseling: Secondary | ICD-10-CM

## 2020-10-10 DIAGNOSIS — Z8249 Family history of ischemic heart disease and other diseases of the circulatory system: Secondary | ICD-10-CM

## 2020-10-10 DIAGNOSIS — I2510 Atherosclerotic heart disease of native coronary artery without angina pectoris: Secondary | ICD-10-CM | POA: Diagnosis not present

## 2020-10-10 DIAGNOSIS — Z79899 Other long term (current) drug therapy: Secondary | ICD-10-CM | POA: Diagnosis not present

## 2020-10-10 MED ORDER — ROSUVASTATIN CALCIUM 5 MG PO TABS: 5.0000 mg | ORAL_TABLET | Freq: Every day | ORAL | 3 refills | Status: DC
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
## 2020-10-10 NOTE — Patient Instructions (Signed)
Medication Instructions:  Start Rosuvastatin (Crestor) 5 mg daily   *If you need a refill on your cardiac medications before your next appointment, please call your pharmacy*   Lab Work: Your physician recommends that you return for lab work in 2 months (fasting lipid, LFT)  If you have labs (blood work) drawn today and your tests are completely normal, you will receive your results only by: [place] (if you have MyChart) OR A paper copy in the mail If you have any lab test that is abnormal or we need to change your treatment, we will call you to review the results.   Testing/Procedures: None ordered today   Follow-Up: At [facility], you and your health needs are our priority.  As part of our continuing mission to provide you with exceptional heart care, we have created designated Provider Care Teams.  These Care Teams include your primary Cardiologist (physician) and Advanced Practice Providers (APPs -  Physician Assistants and Nurse Practitioners) who all work together to provide you with the care you need, when you need it.  We recommend signing up for the patient portal called "MyChart".  Sign up information is provided on this After Visit Summary.  MyChart is used to connect with patients for Virtual Visits (Telemedicine).  Patients are able to view lab/test results, encounter notes, upcoming appointments, etc.  Non-urgent messages can be sent to your provider as well.   To learn more about what you can do with MyChart, go to [email or web address].    Your next appointment:   2 year(s)  The format for your next appointment:   In Person  Provider:   Buford Dresser, MD

## 2020-10-12 ENCOUNTER — Encounter (HOSPITAL_BASED_OUTPATIENT_CLINIC_OR_DEPARTMENT_OTHER): Payer: Self-pay | Admitting: Cardiology

## 2020-10-26 ENCOUNTER — Ambulatory Visit (INDEPENDENT_AMBULATORY_CARE_PROVIDER_SITE_OTHER): Payer: 59 | Admitting: Obstetrics & Gynecology

## 2020-10-26 ENCOUNTER — Encounter (HOSPITAL_BASED_OUTPATIENT_CLINIC_OR_DEPARTMENT_OTHER): Payer: Self-pay | Admitting: Obstetrics & Gynecology

## 2020-10-26 ENCOUNTER — Other Ambulatory Visit: Payer: Self-pay

## 2020-10-26 ENCOUNTER — Other Ambulatory Visit (HOSPITAL_COMMUNITY)
Admission: RE | Admit: 2020-10-26 | Discharge: 2020-10-26 | Disposition: A | Discharge status: Home / Self Care | Payer: 59 | Source: Ambulatory Visit | Attending: Obstetrics & Gynecology | Admitting: Obstetrics & Gynecology

## 2020-10-26 VITALS — BP 127/83 | HR 72 | Ht 61.0 in | Wt 130.8 lb

## 2020-10-26 DIAGNOSIS — N9089 Other specified noninflammatory disorders of vulva and perineum: Secondary | ICD-10-CM | POA: Diagnosis present

## 2020-10-26 DIAGNOSIS — N901 Moderate vulvar dysplasia: Secondary | ICD-10-CM

## 2020-10-26 DIAGNOSIS — A609 Anogenital herpesviral infection, unspecified: Secondary | ICD-10-CM
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
## 2020-10-31 LAB — SURGICAL PATHOLOGY

## 2020-12-06 ENCOUNTER — Telehealth (HOSPITAL_BASED_OUTPATIENT_CLINIC_OR_DEPARTMENT_OTHER): Payer: Self-pay

## 2020-12-06 NOTE — Telephone Encounter (Signed)
Opened in error

## 2020-12-14 ENCOUNTER — Encounter (HOSPITAL_BASED_OUTPATIENT_CLINIC_OR_DEPARTMENT_OTHER): Payer: Self-pay

## 2020-12-21 ENCOUNTER — Ambulatory Visit (INDEPENDENT_AMBULATORY_CARE_PROVIDER_SITE_OTHER): Payer: 59 | Admitting: Obstetrics & Gynecology

## 2020-12-21 ENCOUNTER — Other Ambulatory Visit: Payer: Self-pay

## 2020-12-21 ENCOUNTER — Encounter (HOSPITAL_BASED_OUTPATIENT_CLINIC_OR_DEPARTMENT_OTHER): Payer: Self-pay | Admitting: Obstetrics & Gynecology

## 2020-12-21 VITALS — BP 116/81 | HR 73 | Ht 62.0 in | Wt 130.4 lb

## 2020-12-21 DIAGNOSIS — A609 Anogenital herpesviral infection, unspecified: Secondary | ICD-10-CM | POA: Diagnosis not present

## 2020-12-21 DIAGNOSIS — N901 Moderate vulvar dysplasia: Secondary | ICD-10-CM | POA: Diagnosis not present

## 2020-12-21 MED ORDER — CLOBETASOL PROPIONATE 0.05 % EX OINT: 1.0000 "application " | TOPICAL_OINTMENT | Freq: Two times a day (BID) | CUTANEOUS | 0 refills | Status: DC

## 2020-12-21 MED ORDER — VALACYCLOVIR HCL 1 G PO TABS
1000.0000 mg | ORAL_TABLET | Freq: Two times a day (BID) | ORAL | 0 refills | Status: DC
Start: 2020-12-21 — End: 2021-01-16

## 2020-12-24 NOTE — Progress Notes (Signed)
55 y.o. G57P0020 Married Caucasian female here for vulvar colposcopy with possible biopsies due to recent hx of VIN 2 that was present in lesion removed from vulva.  Pt reports she is having increased issues with HSV and is not feeling under good control.  Is on suppressive therapy.  Patient's last menstrual period was 02/12/2013 (exact date).          Sexually active: Yes.    The current method of family planning is post menopausal status.     Patient has been counseled about results and procedure.  Risks and benefits have bene reviewed including immediate and/or delayed bleeding, infection, cervical scaring from procedure, possibility of needing additional follow up as well as treatment.  Rare risks of missing a lesion discussed as well.  All questions answered.  Pt ready to proceed.  Consent obtained.  BP 116/81 (BP Location: Left Arm, Patient Position: Sitting, Cuff Size: Normal)   Pulse 73   Ht 5\' 2"  (1.575 m) Comment: reported  Wt 130 lb 6.4 oz (59.1 kg)   LMP 02/12/2013 (Exact Date)   BMI 23.85 kg/m   General appearance: alert, cooperative and appears stated age Lymph nodes: No abnormal inguinal nodes palpated Neurologic: Grossly normal  Pelvic: External genitalia:  no lesions              Urethra:  normal appearing urethra with no masses, tenderness or lesions              Bartholins and Skenes: normal                 Vagina: normal appearing vagina with normal color and no discharge, no lesions               Physical Exam  3% acetic acid applied to the entire vulva for 3 minutes.  In systematic fashion, entire vulvar was visualized starting above clitoris and working around the entire vulva in a clock wise fashion.  The area of prior excision was noted.  No AWE was noted.   No biopsies recommended.  An abnormal appearing area was noted to the right of the clitoris and has small vesicular lesions present c/w HSV.  Base was more purplish c/w bruising.    Chaperone, Octaviano Batty,  was present during procedure.  Assessment/Plan: 1. Vulvar intraepithelial neoplasia (VIN) grade 2 - vulvar recheck recommended in 6 months  2. HSV (herpes simplex virus) anogenital infection - valACYclovir (VALTREX) 1000 MG tablet; Take 1 tablet (1,000 mg total) by mouth 2 (two) times daily.  Dispense: 20 tablet; Refill: 0 - clobetasol ointment (TEMOVATE) 0.05 %; Apply 1 application topically 2 (two) times daily. Apply as directed twice daily  Dispense: 60 g; Refill: 0  - recheck 1 month to see if increased valtrex dosing helped and to ensure lesion has resolved.

## 2020-12-27 ENCOUNTER — Other Ambulatory Visit (HOSPITAL_BASED_OUTPATIENT_CLINIC_OR_DEPARTMENT_OTHER): Payer: Self-pay | Admitting: Obstetrics & Gynecology

## 2020-12-27 DIAGNOSIS — A609 Anogenital herpesviral infection, unspecified: Secondary | ICD-10-CM

## 2021-01-09 ENCOUNTER — Encounter (HOSPITAL_BASED_OUTPATIENT_CLINIC_OR_DEPARTMENT_OTHER): Payer: Self-pay | Admitting: *Deleted

## 2021-01-12 ENCOUNTER — Other Ambulatory Visit: Payer: Self-pay

## 2021-01-12 ENCOUNTER — Encounter (HOSPITAL_BASED_OUTPATIENT_CLINIC_OR_DEPARTMENT_OTHER): Payer: Self-pay | Admitting: Nurse Practitioner

## 2021-01-12 ENCOUNTER — Ambulatory Visit (INDEPENDENT_AMBULATORY_CARE_PROVIDER_SITE_OTHER): Payer: 59 | Admitting: Nurse Practitioner

## 2021-01-12 VITALS — BP 130/82 | HR 86 | Ht 61.0 in | Wt 133.9 lb

## 2021-01-12 DIAGNOSIS — K219 Gastro-esophageal reflux disease without esophagitis: Secondary | ICD-10-CM | POA: Diagnosis not present

## 2021-01-12 DIAGNOSIS — M810 Age-related osteoporosis without current pathological fracture: Secondary | ICD-10-CM | POA: Diagnosis not present

## 2021-01-12 DIAGNOSIS — I700 Atherosclerosis of aorta: Secondary | ICD-10-CM | POA: Diagnosis not present

## 2021-01-12 DIAGNOSIS — I2510 Atherosclerotic heart disease of native coronary artery without angina pectoris: Secondary | ICD-10-CM | POA: Diagnosis not present

## 2021-01-12 DIAGNOSIS — Z0000 Encounter for general adult medical examination without abnormal findings: Secondary | ICD-10-CM | POA: Insufficient documentation

## 2021-01-12 DIAGNOSIS — I272 Pulmonary hypertension, unspecified: Secondary | ICD-10-CM

## 2021-01-12 DIAGNOSIS — R635 Abnormal weight gain: Secondary | ICD-10-CM

## 2021-01-12 MED ORDER — PANTOPRAZOLE SODIUM 40 MG PO TBEC
40.0000 mg | DELAYED_RELEASE_TABLET | Freq: Every day | ORAL | 3 refills | Status: DC
Start: 2021-01-12 — End: 2021-12-25
  Filled 2021-10-30: qty 30, 30d supply, fill #0

## 2021-01-12 NOTE — Patient Instructions (Signed)
Thank you for choosing [facility] for your Primary Care needs. I am excited for the opportunity to partner with you to meet your health care goals. It was a pleasure meeting you today!  Recommendations from today's visit: I recommend starting a run/walk program to help get your cardiovascular stamina back up. There are several outlines online that can help you plan out a schedule that works best for you. I typically do not recommend more than 3-4 days a week to avoid wearing yourself down and injury. On days off you can stretch and do yoga which is very beneficial.  I do recommend starting the rosuvastatin. You can take this every other day when you start out to see how you respond to it and work up to once a day. You can take 1200-1565m of calcium a day and up to 5000iU of vitamin d3 a day and we will keep a check on your levels.   Information on diet, exercise, and health maintenance recommendations are listed below. This is information to help you be sure you are on track for optimal health and monitoring.   Please look over this and let [facility] if you have any questions or if you have completed any of the health maintenance outside of [facility] that we can be sure your records are up to date.  ___________________________________________________________ About Me: I am an Adult-Geriatric Nurse Practitioner with a background in caring for patients for more than 20 years with a strong intensive care background. I provide primary care and sports medicine services to patients age 2335and older within this office. My education had a strong focus on caring for the older adult population, which I am passionate about. I am also the director of the APP Fellowship with [facility]   My desire is to provide you with the best service through preventive medicine and supportive care. I consider you a part of the medical team and value your input. I work diligently to  ensure that you are heard and your needs are met in a safe and effective manner. I want you to feel comfortable with me as your provider and want you to know that your health concerns are important to me.  For your information, our office hours are: Monday, Tuesday, and Thursday 8:00 AM - 5:00 PM Wednesday and Friday 8:00 AM - 12:00 PM.   In my time away from the office I am teaching new APP's within the system and am unavailable, but my partner, Dr. dBurnard Buntingis in the office for emergent needs.   If you have questions or concerns, please call our office at [phone] send [facility] MyChart message and we will respond as quickly as possible.  ____________________________________________________________ MyChart:  For all urgent or time sensitive needs we ask that you please call the office to avoid delays. Our number is [phone]. MyChart is not constantly monitored and due to the large volume of messages a day, replies may take up to 72 business hours.  MyChart Policy: MyChart allows for you to see your visit notes, after visit summary, provider recommendations, lab and tests results, make an appointment, request refills, and contact your provider or the office for non-urgent questions or concerns. Providers are seeing patients during normal business hours and do not have built in time to review MyChart messages.  We ask that you allow a minimum of 3 business days for responses to [employer] For this reason, please do  not send urgent requests through [place]. Please call the office at [phone]. New and ongoing conditions may require a visit. We have virtual and in person visit available for your convenience.  Complex MyChart concerns may require a visit. Your provider may request you schedule a virtual or in person visit to ensure we are providing the best care possible. MyChart messages sent after 11:00 AM on Friday will not be received by the provider until Monday morning.    Lab  and Test Results: You will receive your lab and test results on MyChart as soon as they are completed and results have been sent by the lab or testing facility. Due to this service, you will receive your results BEFORE your provider.  I review lab and tests results each morning prior to seeing patients. Some results require collaboration with other providers to ensure you are receiving the most appropriate care. For this reason, we ask that you please allow a minimum of 3-5 business days from the time the ALL results have been received for your provider to receive and review lab and test results and contact you about these.  Most lab and test result comments from the provider will be sent through [place]. Your provider may recommend changes to the plan of care, follow-up visits, repeat testing, ask questions, or request an office visit to discuss these results. You may reply directly to this message or call the office at [phone] to provide information for the provider or set up an appointment. In some instances, you will be called with test results and recommendations. Please let us know if this is preferred and we will make note of this in your chart to provide this for you.    If you have not heard a response to your lab or test results in 5 business days from all results returning to [place], please call the office to let us know. We ask that you please avoid calling prior to this time unless there is an emergent concern. Due to high call volumes, this can delay the resulting process.  After Hours: For all non-emergency after hours needs, please call the office at [phone] and select the option to reach the on-call provider service. On-call services are shared between multiple [place] offices and therefore it will not be possible to speak directly with your provider. On-call providers may provide medical advice and recommendations, but are unable to provide refills for maintenance  medications.  For all emergency or urgent medical needs after normal business hours, we recommend that you seek care at the closest Urgent Care or Emergency Department to ensure appropriate treatment in a timely manner.  [facility] at [place] has a 24 hour emergency room located on the ground floor for your convenience.   Urgent Concerns During the Business Day Providers are seeing patients from 8AM to [place] with a busy schedule and are most often not able to respond to non-urgent calls until the end of the day or the next business day. If you should have URGENT concerns during the day, please call and speak to the nurse or schedule a same day appointment so that we can address your concern without delay.   Thank you, again, for choosing me as your health care partner. I appreciate your trust and look forward to learning more about you.   Worthy Keeler, DNP, AGNP-c ___________________________________________________________  Health Maintenance Recommendations Screening Testing Mammogram Every 1 -2 years based on history and risk factors Starting at age 25 Pap  Smear Ages 21-39 every 3 years Ages 43-65 every 5 years with HPV testing More frequent testing may be required based on results and history Colon Cancer Screening Every 1-10 years based on test performed, risk factors, and history Starting at age 60 Bone Density Screening Every 2-10 years based on history Starting at age 58 for women Recommendations for men differ based on medication usage, history, and risk factors AAA Screening One time ultrasound Men 75-89 years old who have every smoked Lung Cancer Screening Low Dose Lung CT every 12 months Age 82-80 years with a 30 pack-year smoking history who still smoke or who have quit within the last 15 years  Screening Labs Routine  Labs: Complete Blood Count (CBC), Complete Metabolic Panel (CMP), Cholesterol (Lipid Panel) Every 6-12 months based on history and  medications May be recommended more frequently based on current conditions or previous results Hemoglobin A1c Lab Every 3-12 months based on history and previous results Starting at age 37 or earlier with diagnosis of diabetes, high cholesterol, BMI >26, and/or risk factors Frequent monitoring for patients with diabetes to ensure blood sugar control Thyroid Panel (TSH w/ T3 & T4) Every 6 months based on history, symptoms, and risk factors May be repeated more often if on medication HIV One time testing for all patients 67 and older May be repeated more frequently for patients with increased risk factors or exposure Hepatitis C One time testing for all patients 22 and older May be repeated more frequently for patients with increased risk factors or exposure Gonorrhea, Chlamydia Every 12 months for all sexually active persons 13-24 years Additional monitoring may be recommended for those who are considered high risk or who have symptoms PSA Men 33-56 years old with risk factors Additional screening may be recommended from age 60-69 based on risk factors, symptoms, and history  Vaccine Recommendations Tetanus Booster All adults every 10 years Flu Vaccine All patients 6 months and older every year COVID Vaccine All patients 12 years and older Initial dosing with booster May recommend additional booster based on age and health history HPV Vaccine 2 doses all patients age 74-26 Dosing may be considered for patients over 26 Shingles Vaccine (Shingrix) 2 doses all adults 65 years and older Pneumonia (Pneumovax 23) All adults 78 years and older May recommend earlier dosing based on health history Pneumonia (Prevnar 51) All adults 39 years and older Dosed 1 year after Pneumovax 23  Additional Screening, Testing, and Vaccinations may be recommended on an individualized basis based on family history, health history, risk factors, and/or exposure.   __________________________________________________________  Diet Recommendations for All Patients  I recommend that all patients maintain a diet low in saturated fats, carbohydrates, and cholesterol. While this can be challenging at first, it is not impossible and small changes can make big differences.  Things to try: Decreasing the amount of soda, sweet tea, and/or juice to one or less per day and replace with water While water is always the first choice, if you do not like water you may consider adding a water additive without sugar to improve the taste other sugar free drinks Replace potatoes with a brightly colored vegetable at dinner Use healthy oils, such as canola oil or olive oil, instead of butter or hard margarine Limit your bread intake to two pieces or less a day Replace regular pasta with low carb pasta options Bake, broil, or grill foods instead of frying Monitor portion sizes  Eat smaller, more frequent meals throughout the day instead  of large meals  An important thing to remember is, if you love foods that are not great for your health, you don't have to give them up completely. Instead, allow these foods to be a reward when you have done well. Allowing yourself to still have special treats every once in a while is a nice way to tell yourself thank you for working hard to keep yourself healthy.   Also remember that every day is a new day. If you have a bad day and "fall off the wagon", you can still climb right back up and keep moving along on your journey!  We have resources available to help you!  Some websites that may be helpful include: www.http://carter.biz/  Www.VeryWellFit.com _____________________________________________________________  Activity Recommendations for All Patients  I recommend that all adults get at least 20 minutes of moderate physical activity that elevates your heart rate at least 5 days out of the week.  Some examples include: Walking or  jogging at a pace that allows you to carry on a conversation Cycling (stationary bike or outdoors) Water aerobics Yoga Weight lifting Dancing If physical limitations prevent you from putting stress on your joints, exercise in a pool or seated in a chair are excellent options.  Do determine your MAXIMUM heart rate for activity: YOUR AGE - 220 = MAX HeartRate   Remember! Do not push yourself too hard.  Start slowly and build up your pace, speed, weight, time in exercise, etc.  Allow your body to rest between exercise and get good sleep. You will need more water than normal when you are exerting yourself. Do not wait until you are thirsty to drink. Drink with a purpose of getting in at least 8, 8 ounce glasses of water a day plus more depending on how much you exercise and sweat.    If you begin to develop dizziness, chest pain, abdominal pain, jaw pain, shortness of breath, headache, vision changes, lightheadedness, or other concerning symptoms, stop the activity and allow your body to rest. If your symptoms are severe, seek emergency evaluation immediately. If your symptoms are concerning, but not severe, please let us know so that we can recommend further evaluation.
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
## 2021-01-12 NOTE — Assessment & Plan Note (Signed)
We will refill Protonix today. Discussed with patient the risks associated with increased use of the medication on a daily basis including increased bone loss. Given the patient's severity of symptoms okay for her to continue to take on an as-needed basis.

## 2021-01-12 NOTE — Assessment & Plan Note (Signed)
New patient to establish care today. Review of medications, medical history, surgical history, family medical history, social history, labs, and screenings performed today. Review of previous results completed. We will plan for CPE in the fall of next year. No labs today.  Up-to-date on mammogram and DEXA scan. She declines the flu shot and COVID-vaccine.

## 2021-01-12 NOTE — Assessment & Plan Note (Signed)
Patient has concerns of increased weight gain in recent years. Admits that her diet and activity levels have significantly decreased. Recommend monitoring diet and reducing saturated fats to include no more than 13 g of saturated fat per day, no more than 180 g of carbohydrates, and at least 30 g of fiber daily.  Recommendations on activity discussed with patient to include daily activity of at least 20 minutes and incorporate cardiovascular and weight training exercises.

## 2021-01-12 NOTE — Assessment & Plan Note (Signed)
Osteoporosis noted on DEXA scan in 04/2020. T score of -3.1. She does not wish to take prescription medication for management at this however she is taking over-the-counter calcium and vitamin D supplementation. Information provided on recommendations for dosing of calcium and vitamin D to help best protect her bones in addition to increased weight training and avoiding risks that may result in falls.

## 2021-01-12 NOTE — Assessment & Plan Note (Signed)
Records review from CT in 2020 with pulmonology show presence of atherosclerosis in the LAD. Based on this finding and recent cardiac calcium score along with patient's elevated risk of cardiovascular disease with family history recommend start of rosuvastatin. Discussed with patient that she can start by taking this medication on an every other day basis to see how she tolerates it and it can always be increased. Recommend that she discuss with cardiology this option for further guidance. I do feel that the benefits of statin therapy far outweigh the risks for a patient with high risk and known atherosclerosis and risk factors. She will be getting labs from cardiology in the near future to monitor lipid levels and we will see how her levels improve with every other day dosing.

## 2021-01-12 NOTE — Assessment & Plan Note (Signed)
Atherosclerosis of the aorta identified on CT in 2020 and elevated cardiac calcium score noted on recent testing. Encouraged the patient to begin statin therapy with rosuvastatin 5 mg and gave her the option of trialing the medication on an every other day basis to see how she tolerates it. I do feel that the benefits of the medication far outweigh the risks given her risk level. Recommend increased cardiovascular exercise with a run walk program starting with low mileage and slow speeds and slowly increasing as tolerated. Also recommend increased weight training.

## 2021-01-12 NOTE — Assessment & Plan Note (Signed)
Presence of pulmonary hypertension noted on CT in 2020 for evaluation of interstitial lung disease. Recommend continued follow-up with cardiology for evaluation and recommendations. Recommend control of blood pressure, body weight, and routine daily physical activity to help prevent further damage or concerns. No alarm symptoms present today.

## 2021-01-16 NOTE — Telephone Encounter (Signed)
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  She would like to go back on the every day regimen of 500mg  qd.  We discussed the different generics white vs blue.  She states that the white generic works better for her.  I personally called her pharmacy and left a message with the new prescription requesting the white generic Valtrex 500mg  qd [id number].  KW CMA

## 2021-01-20 ENCOUNTER — Other Ambulatory Visit: Payer: Self-pay

## 2021-01-20 ENCOUNTER — Encounter (HOSPITAL_BASED_OUTPATIENT_CLINIC_OR_DEPARTMENT_OTHER): Payer: Self-pay | Admitting: Obstetrics & Gynecology

## 2021-01-20 ENCOUNTER — Ambulatory Visit (INDEPENDENT_AMBULATORY_CARE_PROVIDER_SITE_OTHER): Payer: 59 | Admitting: Obstetrics & Gynecology

## 2021-01-20 VITALS — BP 132/89 | HR 72 | Ht 61.5 in | Wt 133.6 lb

## 2021-01-20 DIAGNOSIS — A609 Anogenital herpesviral infection, unspecified: Secondary | ICD-10-CM | POA: Diagnosis not present

## 2021-01-20 DIAGNOSIS — N952 Postmenopausal atrophic vaginitis: Secondary | ICD-10-CM

## 2021-01-20 DIAGNOSIS — L900 Lichen sclerosus et atrophicus: Secondary | ICD-10-CM

## 2021-01-20 MED ORDER — ESTRADIOL 10 MCG VA TABS: 1.0000 | ORAL_TABLET | VAGINAL | 4 refills | Status: DC

## 2021-01-20 MED ORDER — VALACYCLOVIR HCL 1 G PO TABS: 1000.0000 mg | ORAL_TABLET | Freq: Every day | ORAL | 3 refills | Status: DC
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
## 2021-07-24 ENCOUNTER — Ambulatory Visit (INDEPENDENT_AMBULATORY_CARE_PROVIDER_SITE_OTHER): Payer: 59 | Admitting: Obstetrics & Gynecology

## 2021-07-24 ENCOUNTER — Other Ambulatory Visit (HOSPITAL_BASED_OUTPATIENT_CLINIC_OR_DEPARTMENT_OTHER): Payer: Self-pay

## 2021-07-24 ENCOUNTER — Encounter (HOSPITAL_BASED_OUTPATIENT_CLINIC_OR_DEPARTMENT_OTHER): Payer: Self-pay | Admitting: Obstetrics & Gynecology

## 2021-07-24 VITALS — BP 141/96 | HR 73 | Ht 61.5 in | Wt 127.6 lb

## 2021-07-24 DIAGNOSIS — A609 Anogenital herpesviral infection, unspecified: Secondary | ICD-10-CM | POA: Diagnosis not present

## 2021-07-24 DIAGNOSIS — R69 Illness, unspecified: Secondary | ICD-10-CM | POA: Diagnosis not present

## 2021-07-24 DIAGNOSIS — N901 Moderate vulvar dysplasia: Secondary | ICD-10-CM

## 2021-07-24 MED ORDER — VALACYCLOVIR HCL 500 MG PO TABS
500.0000 mg | ORAL_TABLET | Freq: Two times a day (BID) | ORAL | 3 refills | Status: DC
  Filled 2021-07-24: qty 60, 30d supply, fill #0
  Filled 2021-10-24: qty 60, 30d supply, fill #1
  Filled 2021-12-18: qty 60, 30d supply, fill #2
  Filled 2022-01-31: qty 60, 30d supply, fill #3
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
## 2021-08-29 DIAGNOSIS — H40033 Anatomical narrow angle, bilateral: Secondary | ICD-10-CM | POA: Diagnosis not present

## 2021-08-29 DIAGNOSIS — H16223 Keratoconjunctivitis sicca, not specified as Sjogren's, bilateral: Secondary | ICD-10-CM | POA: Diagnosis not present

## 2021-08-29 DIAGNOSIS — H40053 Ocular hypertension, bilateral: Secondary | ICD-10-CM | POA: Diagnosis not present

## 2021-09-20 DIAGNOSIS — H40039 Anatomical narrow angle, unspecified eye: Secondary | ICD-10-CM | POA: Diagnosis not present

## 2021-09-25 DIAGNOSIS — H16042 Marginal corneal ulcer, left eye: Secondary | ICD-10-CM | POA: Diagnosis not present

## 2021-09-25 DIAGNOSIS — H40033 Anatomical narrow angle, bilateral: Secondary | ICD-10-CM | POA: Diagnosis not present

## 2021-10-19 DIAGNOSIS — H16223 Keratoconjunctivitis sicca, not specified as Sjogren's, bilateral: Secondary | ICD-10-CM | POA: Diagnosis not present

## 2021-10-19 DIAGNOSIS — H40033 Anatomical narrow angle, bilateral: Secondary | ICD-10-CM | POA: Diagnosis not present

## 2021-10-24 ENCOUNTER — Other Ambulatory Visit (HOSPITAL_BASED_OUTPATIENT_CLINIC_OR_DEPARTMENT_OTHER): Payer: Self-pay

## 2021-10-25 ENCOUNTER — Other Ambulatory Visit (HOSPITAL_BASED_OUTPATIENT_CLINIC_OR_DEPARTMENT_OTHER): Payer: Self-pay

## 2021-10-25 ENCOUNTER — Encounter (HOSPITAL_BASED_OUTPATIENT_CLINIC_OR_DEPARTMENT_OTHER): Payer: Self-pay | Admitting: Pharmacist

## 2021-10-27 ENCOUNTER — Other Ambulatory Visit (HOSPITAL_BASED_OUTPATIENT_CLINIC_OR_DEPARTMENT_OTHER): Payer: Self-pay

## 2021-10-30 ENCOUNTER — Other Ambulatory Visit (HOSPITAL_BASED_OUTPATIENT_CLINIC_OR_DEPARTMENT_OTHER): Payer: Self-pay

## 2021-10-30 MED ORDER — VALACYCLOVIR HCL 1 G PO TABS
500.0000 mg | ORAL_TABLET | Freq: Every day | ORAL | 6 refills | Status: DC
  Filled 2021-10-30: qty 15, 30d supply, fill #0

## 2021-11-09 ENCOUNTER — Other Ambulatory Visit (HOSPITAL_BASED_OUTPATIENT_CLINIC_OR_DEPARTMENT_OTHER): Payer: Self-pay

## 2021-12-18 ENCOUNTER — Other Ambulatory Visit (HOSPITAL_BASED_OUTPATIENT_CLINIC_OR_DEPARTMENT_OTHER): Payer: Self-pay

## 2021-12-25 ENCOUNTER — Encounter (HOSPITAL_BASED_OUTPATIENT_CLINIC_OR_DEPARTMENT_OTHER): Payer: Self-pay | Admitting: Obstetrics & Gynecology

## 2021-12-25 ENCOUNTER — Ambulatory Visit (HOSPITAL_BASED_OUTPATIENT_CLINIC_OR_DEPARTMENT_OTHER): Payer: 59 | Admitting: Obstetrics & Gynecology

## 2021-12-25 VITALS — BP 130/86 | HR 73 | Ht 61.0 in | Wt 130.4 lb

## 2021-12-25 DIAGNOSIS — R69 Illness, unspecified: Secondary | ICD-10-CM | POA: Diagnosis not present

## 2021-12-25 DIAGNOSIS — L292 Pruritus vulvae: Secondary | ICD-10-CM

## 2021-12-25 DIAGNOSIS — M818 Other osteoporosis without current pathological fracture: Secondary | ICD-10-CM | POA: Diagnosis not present

## 2021-12-25 DIAGNOSIS — Z1231 Encounter for screening mammogram for malignant neoplasm of breast: Secondary | ICD-10-CM | POA: Diagnosis not present

## 2021-12-25 DIAGNOSIS — Z01419 Encounter for gynecological examination (general) (routine) without abnormal findings: Secondary | ICD-10-CM | POA: Diagnosis not present

## 2021-12-25 DIAGNOSIS — N952 Postmenopausal atrophic vaginitis: Secondary | ICD-10-CM | POA: Diagnosis not present

## 2021-12-25 DIAGNOSIS — A609 Anogenital herpesviral infection, unspecified: Secondary | ICD-10-CM

## 2021-12-25 DIAGNOSIS — R931 Abnormal findings on diagnostic imaging of heart and coronary circulation: Secondary | ICD-10-CM

## 2021-12-25 MED ORDER — ESTRADIOL 10 MCG VA TABS: 1.0000 | ORAL_TABLET | VAGINAL | 4 refills | Status: DC

## 2021-12-25 NOTE — Patient Instructions (Signed)
[facility] [address]  [phone]
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
## 2022-01-19 ENCOUNTER — Ambulatory Visit (HOSPITAL_BASED_OUTPATIENT_CLINIC_OR_DEPARTMENT_OTHER): Payer: 59 | Admitting: Obstetrics & Gynecology

## 2022-01-19 ENCOUNTER — Encounter (HOSPITAL_BASED_OUTPATIENT_CLINIC_OR_DEPARTMENT_OTHER): Payer: 59 | Admitting: Nurse Practitioner

## 2022-01-22 ENCOUNTER — Encounter: Payer: Self-pay | Admitting: Family Medicine

## 2022-01-23 ENCOUNTER — Ambulatory Visit: Payer: 59 | Admitting: Family Medicine

## 2022-01-23 ENCOUNTER — Encounter: Payer: Self-pay | Admitting: Family Medicine

## 2022-01-23 VITALS — BP 118/72 | HR 68 | Temp 98.0°F | Ht 61.0 in | Wt 129.0 lb

## 2022-01-23 DIAGNOSIS — I700 Atherosclerosis of aorta: Secondary | ICD-10-CM

## 2022-01-23 DIAGNOSIS — M810 Age-related osteoporosis without current pathological fracture: Secondary | ICD-10-CM | POA: Diagnosis not present

## 2022-01-23 DIAGNOSIS — E538 Deficiency of other specified B group vitamins: Secondary | ICD-10-CM | POA: Diagnosis not present

## 2022-01-23 DIAGNOSIS — R5383 Other fatigue: Secondary | ICD-10-CM

## 2022-01-23 DIAGNOSIS — Z0000 Encounter for general adult medical examination without abnormal findings: Secondary | ICD-10-CM | POA: Diagnosis not present

## 2022-01-23 DIAGNOSIS — Z7689 Persons encountering health services in other specified circumstances: Secondary | ICD-10-CM

## 2022-01-23 DIAGNOSIS — I2510 Atherosclerotic heart disease of native coronary artery without angina pectoris: Secondary | ICD-10-CM

## 2022-01-23 DIAGNOSIS — Z793 Long term (current) use of hormonal contraceptives: Secondary | ICD-10-CM | POA: Diagnosis not present

## 2022-01-23 NOTE — Patient Instructions (Addendum)
Return in about 1 year (around 01/25/2023).        Great to see you today.  I have refilled the medication(s) we provide.   If labs were collected, we will inform you of lab results once received either by echart message or telephone call.   - echart message- for normal results that have been seen by the patient already.   - telephone call: abnormal results or if patient has not viewed results in their echart. Health Maintenance, Female Adopting a healthy lifestyle and getting preventive care are important in promoting health and wellness. Ask your health care provider about: The right schedule for you to have regular tests and exams. Things you can do on your own to prevent diseases and keep yourself healthy. What should I know about diet, weight, and exercise? Eat a healthy diet  Eat a diet that includes plenty of vegetables, fruits, low-fat dairy products, and lean protein. Do not eat a lot of foods that are high in solid fats, added sugars, or sodium. Maintain a healthy weight Body mass index (BMI) is used to identify weight problems. It estimates body fat based on height and weight. Your health care provider can help determine your BMI and help you achieve or maintain a healthy weight. Get regular exercise Get regular exercise. This is one of the most important things you can do for your health. Most adults should: Exercise for at least 150 minutes each week. The exercise should increase your heart rate and make you sweat (moderate-intensity exercise). Do strengthening exercises at least twice a week. This is in addition to the moderate-intensity exercise. Spend less time sitting. Even light physical activity can be beneficial. Watch cholesterol and blood lipids Have your blood tested for lipids and cholesterol at 56 years of age, then have this test every 5 years. Have your cholesterol levels checked more often if: Your lipid or cholesterol levels are high. You are older than  56 years of age. You are at high risk for heart disease. What should I know about cancer screening? Depending on your health history and family history, you may need to have cancer screening at various ages. This may include screening for: Breast cancer. Cervical cancer. Colorectal cancer. Skin cancer. Lung cancer. What should I know about heart disease, diabetes, and high blood pressure? Blood pressure and heart disease High blood pressure causes heart disease and increases the risk of stroke. This is more likely to develop in people who have high blood pressure readings or are overweight. Have your blood pressure checked: Every 3-5 years if you are 39-45 years of age. Every year if you are 27 years old or older. Diabetes Have regular diabetes screenings. This checks your fasting blood sugar level. Have the screening done: Once every three years after age 54 if you are at a normal weight and have a low risk for diabetes. More often and at a younger age if you are overweight or have a high risk for diabetes. What should I know about preventing infection? Hepatitis B If you have a higher risk for hepatitis B, you should be screened for this virus. Talk with your health care provider to find out if you are at risk for hepatitis B infection. Hepatitis C Testing is recommended for: Everyone born from [birth year] through 1965. Anyone with known risk factors for hepatitis C. Sexually transmitted infections (STIs) Get screened for STIs, including gonorrhea and chlamydia, if: You are sexually active and are younger than 56 years of age.  You are older than 56 years of age and your health care provider tells you that you are at risk for this type of infection. Your sexual activity has changed since you were last screened, and you are at increased risk for chlamydia or gonorrhea. Ask your health care provider if you are at risk. Ask your health care provider about whether you are at high risk for HIV.  Your health care provider may recommend a prescription medicine to help prevent HIV infection. If you choose to take medicine to prevent HIV, you should first get tested for HIV. You should then be tested every 3 months for as long as you are taking the medicine. Pregnancy If you are about to stop having your period (premenopausal) and you may become pregnant, seek counseling before you get pregnant. Take 400 to 800 micrograms (mcg) of folic acid every day if you become pregnant. Ask for birth control (contraception) if you want to prevent pregnancy. Osteoporosis and menopause Osteoporosis is a disease in which the bones lose minerals and strength with aging. This can result in bone fractures. If you are 26 years old or older, or if you are at risk for osteoporosis and fractures, ask your health care provider if you should: Be screened for bone loss. Take a calcium or vitamin D supplement to lower your risk of fractures. Be given hormone replacement therapy (HRT) to treat symptoms of menopause. Follow these instructions at home: Alcohol use Do not drink alcohol if: Your health care provider tells you not to drink. You are pregnant, may be pregnant, or are planning to become pregnant. If you drink alcohol: Limit how much you have to: 0-1 drink a day. Know how much alcohol is in your drink. In the [place] one drink equals one 12 oz bottle of beer (355 mL), one 5 oz glass of wine (148 mL), or one 1 oz glass of hard liquor (44 mL). Lifestyle Do not use any products that contain nicotine or tobacco. These products include cigarettes, chewing tobacco, and vaping devices, such as e-cigarettes. If you need help quitting, ask your health care provider. Do not use street drugs. Do not share needles. Ask your health care provider for help if you need support or information about quitting drugs. General instructions Schedule regular health, dental, and eye exams. Stay current with your vaccines. Tell  your health care provider if: You often feel depressed. You have ever been abused or do not feel safe at home. Summary Adopting a healthy lifestyle and getting preventive care are important in promoting health and wellness. Follow your health care provider's instructions about healthy diet, exercising, and getting tested or screened for diseases. Follow your health care provider's instructions on monitoring your cholesterol and blood pressure. This information is not intended to replace advice given to you by your health care provider. Make sure you discuss any questions you have with your health care provider. Document Revised: 06/20/2020 Document Reviewed: 06/20/2020 Elsevier Patient Education  [place].
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
## 2022-01-24 ENCOUNTER — Telehealth: Payer: Self-pay | Admitting: Family Medicine

## 2022-01-24 LAB — CBC WITH DIFFERENTIAL/PLATELET
Absolute Monocytes: 614 cells/uL (ref 200–950)
Basophils Absolute: 48 cells/uL (ref 0–200)
Basophils Relative: 0.7 %
Eosinophils Absolute: 152 cells/uL (ref 15–500)
Eosinophils Relative: 2.2 %
HCT: 39.4 % (ref 35.0–45.0)
Hemoglobin: 14 g/dL (ref 11.7–15.5)
Lymphs Abs: 3105 cells/uL (ref 850–3900)
MCH: 32.5 pg (ref 27.0–33.0)
MCHC: 35.5 g/dL (ref 32.0–36.0)
MCV: 91.4 fL (ref 80.0–100.0)
MPV: 9 fL (ref 7.5–12.5)
Monocytes Relative: 8.9 %
Neutro Abs: 2981 cells/uL (ref 1500–7800)
Neutrophils Relative %: 43.2 %
Platelets: 403 10*3/uL — ABNORMAL HIGH (ref 140–400)
RBC: 4.31 10*6/uL (ref 3.80–5.10)
RDW: 12.3 % (ref 11.0–15.0)
Total Lymphocyte: 45 %
WBC: 6.9 10*3/uL (ref 3.8–10.8)

## 2022-01-24 LAB — LIPID PANEL
Cholesterol: 177 mg/dL (ref <200)
HDL: 63 mg/dL (ref >=50)
LDL Cholesterol (Calc): 95 mg/dL (calc)
Non-HDL Cholesterol (Calc): 114 mg/dL (calc) (ref <130)
Total CHOL/HDL Ratio: 2.8 (calc) (ref <5.0)
Triglycerides: 92 mg/dL (ref <150)

## 2022-01-24 LAB — COMPREHENSIVE METABOLIC PANEL
AG Ratio: 1.9 (calc) (ref 1.0–2.5)
ALT: 17 U/L (ref 6–29)
AST: 16 U/L (ref 10–35)
Albumin: 5 g/dL (ref 3.6–5.1)
Alkaline phosphatase (APISO): 76 U/L (ref 37–153)
BUN: 12 mg/dL (ref 7–25)
CO2: 27 mmol/L (ref 20–32)
Calcium: 10.1 mg/dL (ref 8.6–10.4)
Chloride: 98 mmol/L (ref 98–110)
Creat: 0.63 mg/dL (ref 0.50–1.03)
Globulin: 2.7 g/dL (calc) (ref 1.9–3.7)
Glucose, Bld: 92 mg/dL (ref 65–99)
Potassium: 4.3 mmol/L (ref 3.5–5.3)
Sodium: 134 mmol/L — ABNORMAL LOW (ref 135–146)
Total Bilirubin: 0.5 mg/dL (ref 0.2–1.2)
Total Protein: 7.7 g/dL (ref 6.1–8.1)

## 2022-01-24 LAB — VITAMIN D 25 HYDROXY (VIT D DEFICIENCY, FRACTURES): Vit D, 25-Hydroxy: 48 ng/mL (ref 30–100)

## 2022-01-24 LAB — IRON,TIBC AND FERRITIN PANEL
%SAT: 24 % (calc) (ref 16–45)
Ferritin: 47 ng/mL (ref 16–232)
Iron: 95 ug/dL (ref 45–160)
TIBC: 403 mcg/dL (calc) (ref 250–450)

## 2022-01-24 LAB — HEMOGLOBIN A1C
Hgb A1c MFr Bld: 5.4 % of total Hgb (ref <5.7)
Mean Plasma Glucose: 108 mg/dL
eAG (mmol/L): 6 mmol/L

## 2022-01-24 LAB — VITAMIN B12: Vitamin B-12: 302 pg/mL (ref 200–1100)

## 2022-01-24 LAB — TSH: TSH: 1.95 mIU/L (ref 0.40–4.50)

## 2022-01-24 NOTE — Telephone Encounter (Signed)
LM for pt to return call to discuss.  

## 2022-01-24 NOTE — Telephone Encounter (Signed)
Please call patient Liver, kidney and thyroid function are normal Blood cell counts and electrolytes are normal.  Sodium levels are just barely low at 134, however this seems to be stable for her and likely not contributing to any of her symptoms.  If she would drink something like a V8 juice, coconut water or electrolyte replacement drink daily it could increase her sodium levels into normal range. Diabetes screening/A1c is normal  Cholesterol panel is at goal with an LDL/bad cholesterol of 95 and a HDL/good cholesterol of 63.  We discussed the low-dose statin 3 times a week for cardiovascular protection during her visit.  If she decides she would like to be prescribed a statin, please advise and I will call it in for her.  Vit d-normal level, looks great!  She can continue an average of 3000 units of vitamin D daily.   iron levels are excellent, as well as her iron stores.  B12 is low at 302.  I would recommend she have B12 injections completed by nurse visit to increase her B12 levels into normal range, then we may be able to keep her maintained on a sublingual B12 solution thereafter.  We will have to wait to see how she responds to the B12 injections first before guiding her on sublingual solution dosage. B12 IM injection every 2 weeks x 6 doses.follow-up with provider on the day of the last dose for recheck.

## 2022-01-25 ENCOUNTER — Encounter: Payer: Self-pay | Admitting: Family Medicine

## 2022-01-25 NOTE — Telephone Encounter (Signed)
LVM for pt to CB regarding results.  

## 2022-01-25 NOTE — Telephone Encounter (Signed)
Spoke with pt regarding labs and instructions.   

## 2022-01-25 NOTE — Telephone Encounter (Signed)
Please advise 

## 2022-01-25 NOTE — Telephone Encounter (Signed)
Pt is asking for a call back about lab results.

## 2022-01-25 NOTE — Telephone Encounter (Signed)
I am not certain what she is asking my thoughts on specifically.  Can you ask her to clarify her question? Is she asking about nexium causing worsening osteoporosis?

## 2022-01-26 ENCOUNTER — Ambulatory Visit (INDEPENDENT_AMBULATORY_CARE_PROVIDER_SITE_OTHER): Payer: 59

## 2022-01-26 DIAGNOSIS — E538 Deficiency of other specified B group vitamins: Secondary | ICD-10-CM

## 2022-01-26 MED ORDER — CYANOCOBALAMIN 1000 MCG/ML IJ SOLN
1000.0000 ug | INTRAMUSCULAR | Status: AC
  Administered 2022-01-26 – 2022-04-11 (×5): 1000 ug via INTRAMUSCULAR

## 2022-01-26 NOTE — Telephone Encounter (Signed)
Please see message below

## 2022-01-26 NOTE — Addendum Note (Signed)
Addended by: Howard Pouch A on: 01/26/2022 05:10 PM   Modules accepted: Level of Service

## 2022-01-26 NOTE — Progress Notes (Cosign Needed Addendum)
Pt here today for 1st biweekly B12 inj. Shot was given with no issue. Rx provided by office.

## 2022-02-01 ENCOUNTER — Inpatient Hospital Stay (HOSPITAL_BASED_OUTPATIENT_CLINIC_OR_DEPARTMENT_OTHER): Admission: RE | Admit: 2022-02-01 | Payer: 59 | Source: Ambulatory Visit | Admitting: Radiology

## 2022-02-01 ENCOUNTER — Ambulatory Visit (HOSPITAL_BASED_OUTPATIENT_CLINIC_OR_DEPARTMENT_OTHER)
Admission: RE | Admit: 2022-02-01 | Discharge: 2022-02-01 | Disposition: A | Discharge status: Home / Self Care | Payer: 59 | Source: Ambulatory Visit | Attending: Obstetrics & Gynecology | Admitting: Obstetrics & Gynecology

## 2022-02-01 DIAGNOSIS — Z1231 Encounter for screening mammogram for malignant neoplasm of breast: Secondary | ICD-10-CM | POA: Insufficient documentation

## 2022-02-08 ENCOUNTER — Ambulatory Visit: Payer: 59

## 2022-02-08 ENCOUNTER — Ambulatory Visit (INDEPENDENT_AMBULATORY_CARE_PROVIDER_SITE_OTHER): Payer: 59

## 2022-02-08 DIAGNOSIS — E538 Deficiency of other specified B group vitamins: Secondary | ICD-10-CM | POA: Diagnosis not present

## 2022-02-08 MED ORDER — CYANOCOBALAMIN 1000 MCG/ML IJ SOLN
1000.0000 ug | Freq: Once | INTRAMUSCULAR | Status: AC
  Administered 2022-02-08: 1000 ug via INTRAMUSCULAR

## 2022-02-08 NOTE — Progress Notes (Signed)
Pt here for biweekly B12 injection per Dr.Kuneff  B12 1077mcg given IM left deltoid, and pt tolerated injection well.  Next B12 injection scheduled for 2 weeks.

## 2022-02-23 ENCOUNTER — Ambulatory Visit (INDEPENDENT_AMBULATORY_CARE_PROVIDER_SITE_OTHER): Payer: 59

## 2022-02-23 DIAGNOSIS — E538 Deficiency of other specified B group vitamins: Secondary | ICD-10-CM | POA: Diagnosis not present

## 2022-02-23 NOTE — Progress Notes (Signed)
Pt here for biweekly B12 injection per Dr.Kuneff   B12 given IM right deltoid, and pt tolerated injection well.   Next B12 injection scheduled for 2 weeks.

## 2022-03-09 ENCOUNTER — Ambulatory Visit (INDEPENDENT_AMBULATORY_CARE_PROVIDER_SITE_OTHER): Payer: 59

## 2022-03-09 DIAGNOSIS — E538 Deficiency of other specified B group vitamins: Secondary | ICD-10-CM | POA: Diagnosis not present

## 2022-03-13 NOTE — Progress Notes (Addendum)
Pt here today for biweekly B12 shot. Shot was given with no issue. Rx provided by office.

## 2022-03-28 ENCOUNTER — Ambulatory Visit (INDEPENDENT_AMBULATORY_CARE_PROVIDER_SITE_OTHER): Payer: 59

## 2022-03-28 DIAGNOSIS — E538 Deficiency of other specified B group vitamins: Secondary | ICD-10-CM

## 2022-03-28 NOTE — Progress Notes (Signed)
Pt here for biweekly B12 injection per Dr.Kuneff   B12 1015mcg given IM right deltoid, and pt tolerated injection well.   Next B12 injection scheduled for 2 weeks.      Please sign in absence of PCP

## 2022-04-11 ENCOUNTER — Ambulatory Visit (INDEPENDENT_AMBULATORY_CARE_PROVIDER_SITE_OTHER): Payer: 59

## 2022-04-11 DIAGNOSIS — E538 Deficiency of other specified B group vitamins: Secondary | ICD-10-CM

## 2022-04-17 NOTE — Progress Notes (Signed)
Pt here for biweekly B12 injection per Dr.Kuneff   B12 1000mcg given IM right deltoid, and pt tolerated injection well.   Next B12 injection scheduled for 2 weeks. 

## 2022-04-25 ENCOUNTER — Encounter: Payer: Self-pay | Admitting: Family Medicine

## 2022-04-25 ENCOUNTER — Ambulatory Visit: Payer: 59 | Admitting: Family Medicine

## 2022-04-25 VITALS — BP 118/78 | HR 93 | Temp 98.0°F | Wt 134.2 lb

## 2022-04-25 DIAGNOSIS — L989 Disorder of the skin and subcutaneous tissue, unspecified: Secondary | ICD-10-CM

## 2022-04-25 DIAGNOSIS — E538 Deficiency of other specified B group vitamins: Secondary | ICD-10-CM | POA: Diagnosis not present

## 2022-04-25 MED ORDER — CYANOCOBALAMIN 1000 MCG/ML IJ SOLN
1000.0000 ug | Freq: Once | INTRAMUSCULAR | Status: AC
  Administered 2022-04-25: 1000 ug via INTRAMUSCULAR

## 2022-04-25 NOTE — Addendum Note (Signed)
Addended by: Beatrix Fetters on: 04/25/2022 03:11 PM   Modules accepted: Orders
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
## 2022-04-25 NOTE — Patient Instructions (Signed)
No follow-ups on file.        Great to see you today.  I have refilled the medication(s) we provide.   If labs were collected, we will inform you of lab results once received either by echart message or telephone call.   - echart message- for normal results that have been seen by the patient already.   - telephone call: abnormal results or if patient has not viewed results in their echart.  

## 2022-04-26 ENCOUNTER — Telehealth: Payer: Self-pay | Admitting: Family Medicine

## 2022-04-26 LAB — VITAMIN B12: Vitamin B-12: 409 pg/mL (ref 200–1100)

## 2022-04-26 NOTE — Telephone Encounter (Signed)
Spoke with patient regarding results/recommendations.  

## 2022-04-26 NOTE — Telephone Encounter (Signed)
Please inform patient: Her B12 is now 409.  Although higher than last collection, if she is not maintaining the B12 levels very long after injections. I would recommend she start sublingual B12 1000 mcg solution daily (which is over-the-counter) and continue B12 injections on a monthly basis. Or-if she is comfortable giving her own B12 injection she can continue B12 injections at home every 2 weeks.   Please call and B12 injections to her pharmacy to self administer if that is her choice. Or set her up with monthly injection by nurse visit.  Thanks

## 2022-05-15 ENCOUNTER — Encounter: Payer: Self-pay | Admitting: Family Medicine

## 2022-05-15 ENCOUNTER — Ambulatory Visit: Payer: 59 | Admitting: Family Medicine

## 2022-05-15 VITALS — BP 128/80 | Ht 61.0 in | Wt 134.0 lb

## 2022-05-15 DIAGNOSIS — M4125 Other idiopathic scoliosis, thoracolumbar region: Secondary | ICD-10-CM | POA: Diagnosis not present

## 2022-05-15 DIAGNOSIS — M810 Age-related osteoporosis without current pathological fracture: Secondary | ICD-10-CM | POA: Diagnosis not present

## 2022-05-15 NOTE — Assessment & Plan Note (Signed)
Acute on chronic in nature.  She does have limited mobility and wants to be more active. -Counseled on home exercise therapy and supportive care. -Referral to physical therapy.

## 2022-05-15 NOTE — Assessment & Plan Note (Signed)
Acute on chronic in nature.  She does have a higher risk T-score.  Has not been previously treated. -Counseled on home exercise therapy and supportive care. -Check vitamin D, SPEP, IFE, PTH and calcium  -Would consider Prolia given the high risk T-score.

## 2022-05-15 NOTE — Patient Instructions (Signed)
Nice to meet you We have made a referral to physical therapy  You can also try Pilate's  We'll call with the lab results  We will run the benefits of the prolia   Please send me a message in [place] with any questions or updates.  Please see me back in 4 weeks.   --Dr. Raeford Razor
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
## 2022-05-18 LAB — IMMUNOFIXATION ELECTROPHORESIS
IgA/Immunoglobulin A, Serum: 106 mg/dL (ref 87–352)
IgG (Immunoglobin G), Serum: 1066 mg/dL (ref 586–1602)
IgM (Immunoglobulin M), Srm: 71 mg/dL (ref 26–217)
Total Protein: 7.5 g/dL (ref 6.0–8.5)

## 2022-05-18 LAB — PTH, INTACT AND CALCIUM
Calcium: 10 mg/dL (ref 8.7–10.2)
PTH: 29 pg/mL (ref 15–65)

## 2022-05-18 LAB — VITAMIN D 25 HYDROXY (VIT D DEFICIENCY, FRACTURES): Vit D, 25-Hydroxy: 45.4 ng/mL (ref 30.0–100.0)

## 2022-05-18 LAB — PROTEIN ELECTROPHORESIS, SERUM
A/G Ratio: 1.3 (ref 0.7–1.7)
Albumin ELP: 4.3 g/dL (ref 2.9–4.4)
Alpha 1: 0.2 g/dL (ref 0.0–0.4)
Alpha 2: 0.7 g/dL (ref 0.4–1.0)
Beta: 1.1 g/dL (ref 0.7–1.3)
Gamma Globulin: 1.2 g/dL (ref 0.4–1.8)
Globulin, Total: 3.2 g/dL (ref 2.2–3.9)

## 2022-05-21 ENCOUNTER — Telehealth: Payer: Self-pay | Admitting: Family Medicine

## 2022-05-21 NOTE — Telephone Encounter (Signed)
Left VM for patient. If she calls back please have her speak with a nurse/CMA and inform that her labs are normal.   If any questions then please take the best time and phone number to call and I will try to call her back.   Myra Rude, MD [facility] 05/21/2022, 9:23 AM

## 2022-05-22 ENCOUNTER — Telehealth: Payer: Self-pay

## 2022-05-22 NOTE — Telephone Encounter (Signed)
From: Merrilyn Puma, CMA  Sent: 05/15/2022   2:33 PM EDT  To: Dierdre Searles, CMA  Subject: Annye English

## 2022-05-22 NOTE — Telephone Encounter (Signed)
Pt informed of below.  

## 2022-05-25 ENCOUNTER — Ambulatory Visit: Payer: 59

## 2022-05-26 NOTE — Telephone Encounter (Signed)
Prolia VOB initiated via [email or web address] ? ?New start ? ?

## 2022-05-28 ENCOUNTER — Encounter: Payer: Self-pay | Admitting: *Deleted

## 2022-05-28 ENCOUNTER — Ambulatory Visit (INDEPENDENT_AMBULATORY_CARE_PROVIDER_SITE_OTHER): Payer: 59

## 2022-05-28 DIAGNOSIS — E538 Deficiency of other specified B group vitamins: Secondary | ICD-10-CM

## 2022-05-28 MED ORDER — CYANOCOBALAMIN 1000 MCG/ML IJ SOLN
1000.0000 ug | Freq: Once | INTRAMUSCULAR | Status: AC
Start: 2022-05-28 — End: 2022-05-28
  Administered 2022-05-28: 1000 ug via INTRAMUSCULAR

## 2022-05-28 NOTE — Progress Notes (Signed)
Pt here for monthly B12 injection per Dr.Kuneff   B12 1000mcg given IM, and pt tolerated injection well.   Next B12 injection scheduled for 1 month.       

## 2022-05-31 ENCOUNTER — Encounter (HOSPITAL_BASED_OUTPATIENT_CLINIC_OR_DEPARTMENT_OTHER): Payer: Self-pay | Admitting: Obstetrics & Gynecology

## 2022-05-31 ENCOUNTER — Other Ambulatory Visit (HOSPITAL_BASED_OUTPATIENT_CLINIC_OR_DEPARTMENT_OTHER): Payer: Self-pay | Admitting: *Deleted

## 2022-05-31 DIAGNOSIS — N952 Postmenopausal atrophic vaginitis: Secondary | ICD-10-CM

## 2022-05-31 MED ORDER — ESTRADIOL 10 MCG VA TABS
1.0000 | ORAL_TABLET | VAGINAL | 3 refills | Status: DC
Start: 2022-06-01 — End: 2022-12-27

## 2022-06-04 NOTE — Telephone Encounter (Signed)
Prior auth required for PROLIA  PA PROCESS DETAILS: PA is required. Call [phone] or complete the PA form available at [email or web address] Fax completed form to [phone]. Or submit PA request online at [email or web address]

## 2022-06-04 NOTE — Telephone Encounter (Addendum)
Prior Authorization initiated for [facility] via Av[employer]ity/[employer] Case ID: [id number]

## 2022-06-07 NOTE — Telephone Encounter (Signed)
Rec'd fax from [facility] PA is approved 06/04/22 until 06/03/23.  Case #: [id number]

## 2022-06-12 ENCOUNTER — Ambulatory Visit: Payer: 59 | Admitting: Family Medicine

## 2022-06-12 ENCOUNTER — Encounter: Payer: Self-pay | Admitting: Family Medicine

## 2022-06-12 VITALS — BP 122/80 | Ht 61.0 in | Wt 130.0 lb

## 2022-06-12 DIAGNOSIS — M4125 Other idiopathic scoliosis, thoracolumbar region: Secondary | ICD-10-CM

## 2022-06-12 DIAGNOSIS — M2142 Flat foot [pes planus] (acquired), left foot: Secondary | ICD-10-CM

## 2022-06-12 DIAGNOSIS — M2141 Flat foot [pes planus] (acquired), right foot: Secondary | ICD-10-CM | POA: Diagnosis not present

## 2022-06-12 NOTE — Patient Instructions (Signed)
Good to see you We'll send a different referral for physical therapy   Please send me a message in MyChart with any questions or updates.  Please see me back to have custom orthotics.   --Dr. Jordan Likes
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
## 2022-06-12 NOTE — Assessment & Plan Note (Signed)
Acute on chronic in nature.   -Counseled on home exercise therapy and supportive care. -Referral to physical therapy. 

## 2022-06-12 NOTE — Assessment & Plan Note (Signed)
Acute on chronic in nature.  She has a long history of running and runs on a regular basis. -Counseled on home exercise therapy and supportive care. -Can pursue custom orthotics

## 2022-06-16 NOTE — Telephone Encounter (Signed)
Prior Auth: APPROVED PA# [id number] Valid: 06/04/22-06/03/23

## 2022-06-16 NOTE — Telephone Encounter (Signed)
Pt ready for scheduling on or after 06/04/22  Out-of-pocket cost due at time of visit: $818 ($25 if using [employer] Co-Pay Assistance Program)  Primary: [place] HMO Bronze Prolia co-insurance: 50% (approx [id number]) Admin fee co-insurance: 50% (approx $62)  Deductible: $138.15 of $7,500 met  Prior Auth: APPROVED PA# [id number] Valid: 06/04/22-06/03/23  Secondary: N/A Prolia co-insurance:  Admin fee co-insurance:  Deductible:  Prior Auth:  PA# Valid:     ** This summary of benefits is an estimation of the patient's out-of-pocket cost. Exact cost may very based on individual plan coverage.    COPAY ENROLLMENT  Co-Pay Member ID [id number]

## 2022-06-19 NOTE — Telephone Encounter (Signed)
Pt informed of below.  She will discuss this further with Dr. Jordan Likes at her 07/03/22 OV.

## 2022-06-20 ENCOUNTER — Encounter: Payer: Self-pay | Admitting: Family Medicine

## 2022-06-27 ENCOUNTER — Ambulatory Visit: Payer: 59

## 2022-06-28 ENCOUNTER — Ambulatory Visit: Payer: 59

## 2022-07-03 ENCOUNTER — Encounter: Payer: 59 | Admitting: Family Medicine

## 2022-07-04 ENCOUNTER — Encounter: Payer: 59 | Admitting: Family Medicine

## 2022-07-04 ENCOUNTER — Ambulatory Visit: Payer: 59

## 2022-07-11 ENCOUNTER — Ambulatory Visit (INDEPENDENT_AMBULATORY_CARE_PROVIDER_SITE_OTHER): Payer: 59

## 2022-07-11 DIAGNOSIS — E538 Deficiency of other specified B group vitamins: Secondary | ICD-10-CM

## 2022-07-11 MED ORDER — CYANOCOBALAMIN 1000 MCG/ML IJ SOLN
1000.0000 ug | Freq: Once | INTRAMUSCULAR | Status: AC
Start: 2022-07-11 — End: 2022-07-11
  Administered 2022-07-11: 1000 ug via INTRAMUSCULAR

## 2022-07-11 NOTE — Progress Notes (Signed)
Pt here for monthly B12 injection per Dr.Kuneff   B12 given IM, and pt tolerated injection well.   Next B12 injection scheduled for 1 month.  Please sign in absence of PCP

## 2022-07-14 NOTE — Telephone Encounter (Signed)
Visit cancelled by patient. Will this patient be transferring to [facility]?

## 2022-07-19 NOTE — Telephone Encounter (Signed)
Left message for patient to call back  

## 2022-07-24 NOTE — Telephone Encounter (Signed)
Last Prolia inj 07/11/22 Next Prolia inj due 01/12/23

## 2022-07-30 ENCOUNTER — Other Ambulatory Visit (HOSPITAL_BASED_OUTPATIENT_CLINIC_OR_DEPARTMENT_OTHER): Payer: Self-pay | Admitting: Obstetrics & Gynecology

## 2022-07-30 ENCOUNTER — Other Ambulatory Visit (HOSPITAL_BASED_OUTPATIENT_CLINIC_OR_DEPARTMENT_OTHER): Payer: Self-pay

## 2022-07-30 MED ORDER — VALACYCLOVIR HCL 500 MG PO TABS
500.0000 mg | ORAL_TABLET | Freq: Two times a day (BID) | ORAL | 3 refills | Status: DC
  Filled 2022-07-30: qty 60, 30d supply, fill #0
  Filled 2022-09-05: qty 60, 30d supply, fill #1
  Filled 2022-11-21: qty 60, 30d supply, fill #2
  Filled 2023-01-24: qty 60, 30d supply, fill #3
  Filled 2023-04-29 – 2023-05-01 (×2): qty 60, 30d supply, fill #4

## 2022-08-01 ENCOUNTER — Other Ambulatory Visit (HOSPITAL_BASED_OUTPATIENT_CLINIC_OR_DEPARTMENT_OTHER): Payer: Self-pay

## 2022-08-20 IMAGING — MG MM DIGITAL SCREENING BILAT W/ TOMO AND CAD
8 series · 8 of 24 positions shown · non-contrast
Comparison: Previous exam(s).

CLINICAL DATA: Screening.

EXAM:
DIGITAL SCREENING BILATERAL MAMMOGRAM WITH TOMOSYNTHESIS AND CAD
TECHNIQUE: Bilateral screening digital craniocaudal and mediolateral oblique
mammograms were obtained. Bilateral screening digital breast
tomosynthesis was performed. The images were evaluated with
computer-aided detection.

[R MLO synth-2D]
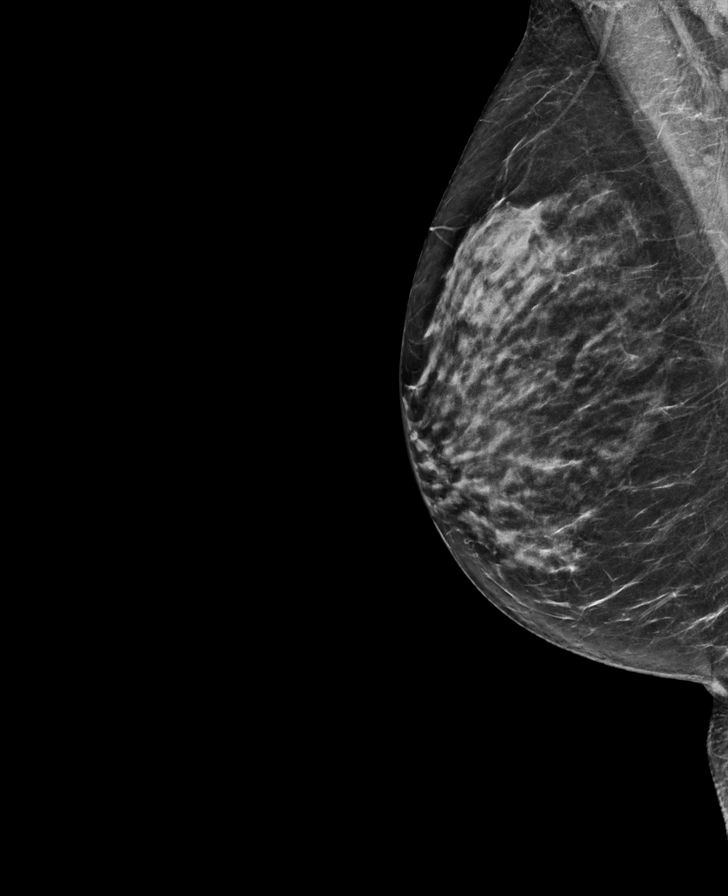

[L CC synth-2D]
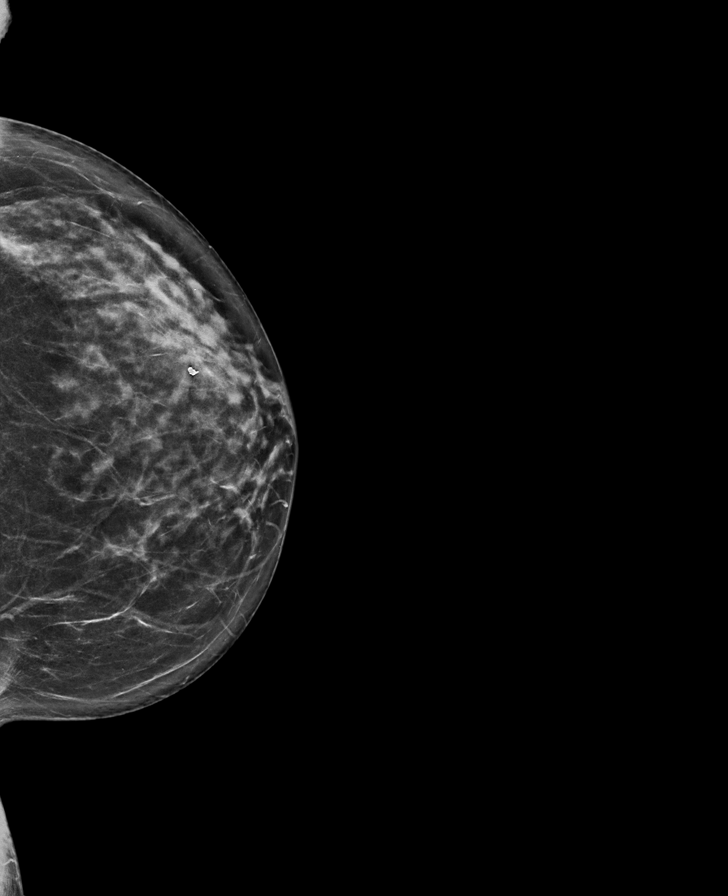

[L MLO synth-2D]
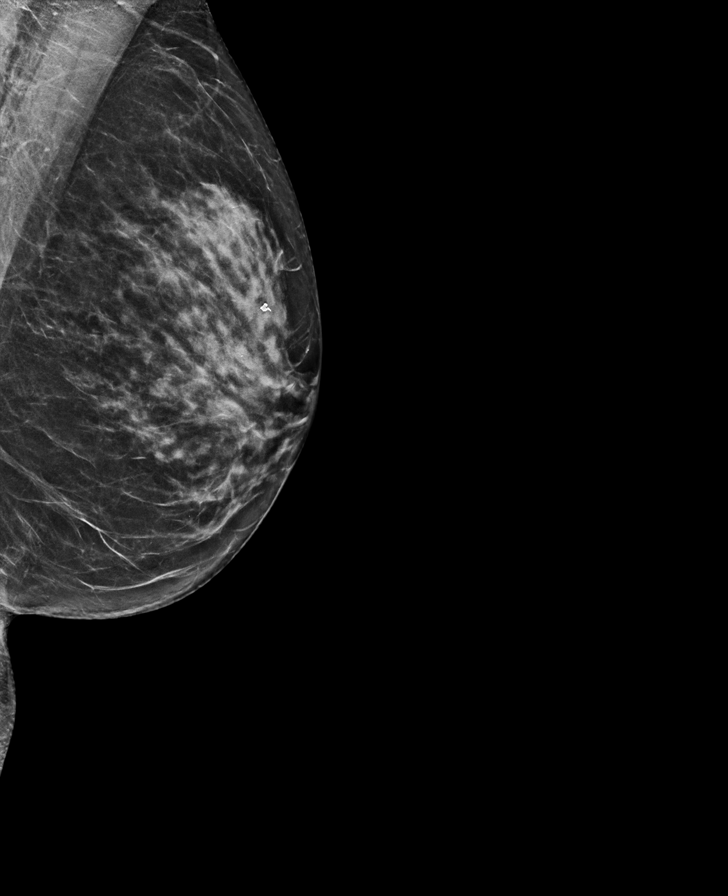

[R CC synth-2D]
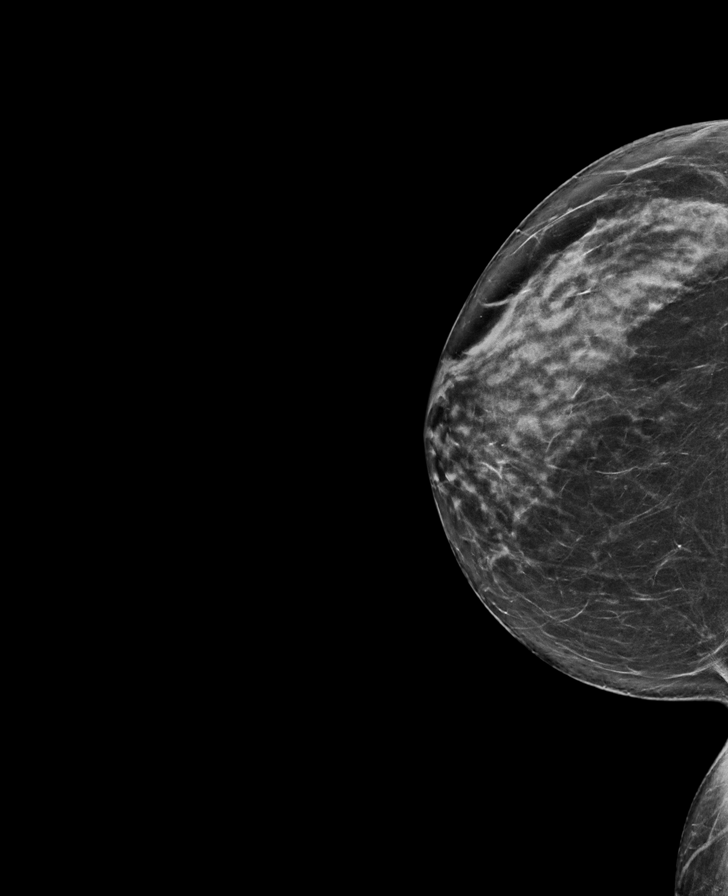

[R MLO tomo · tomo slice 33/65.0]
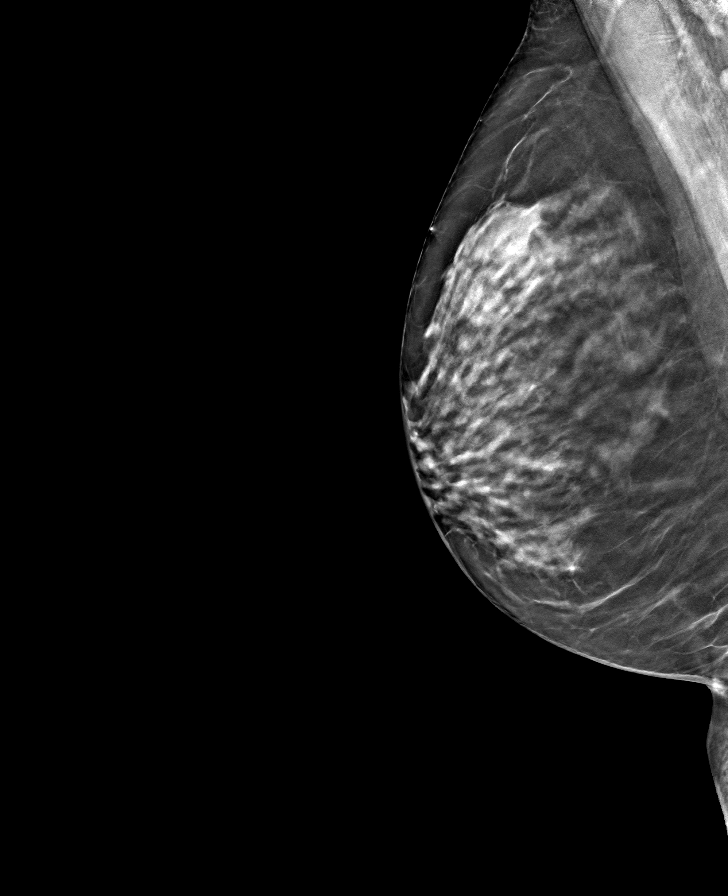

[R CC tomo · tomo slice 37/72.0]
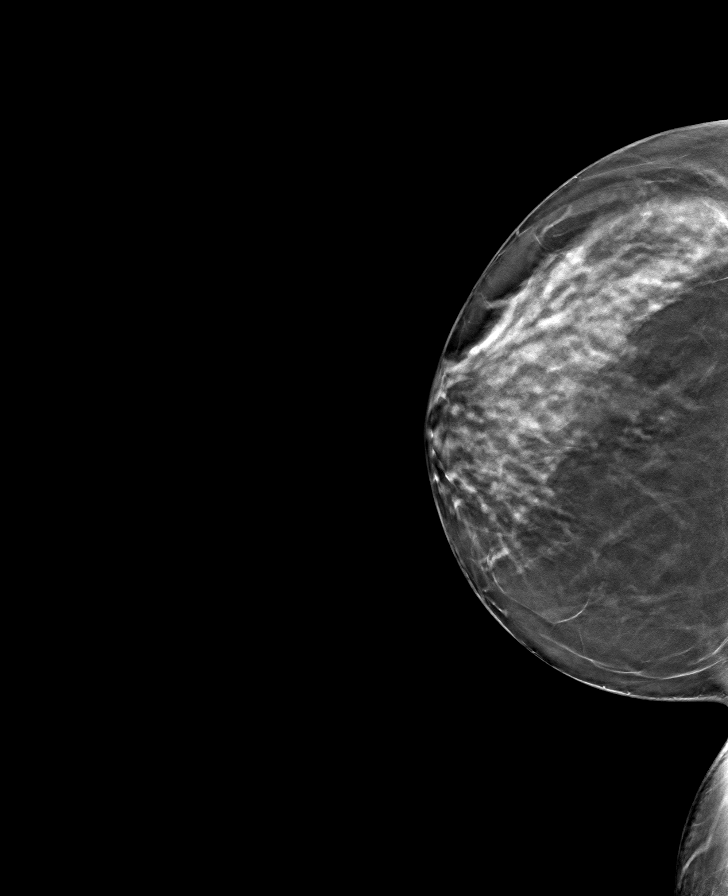

[L MLO tomo · tomo slice 34/67.0]
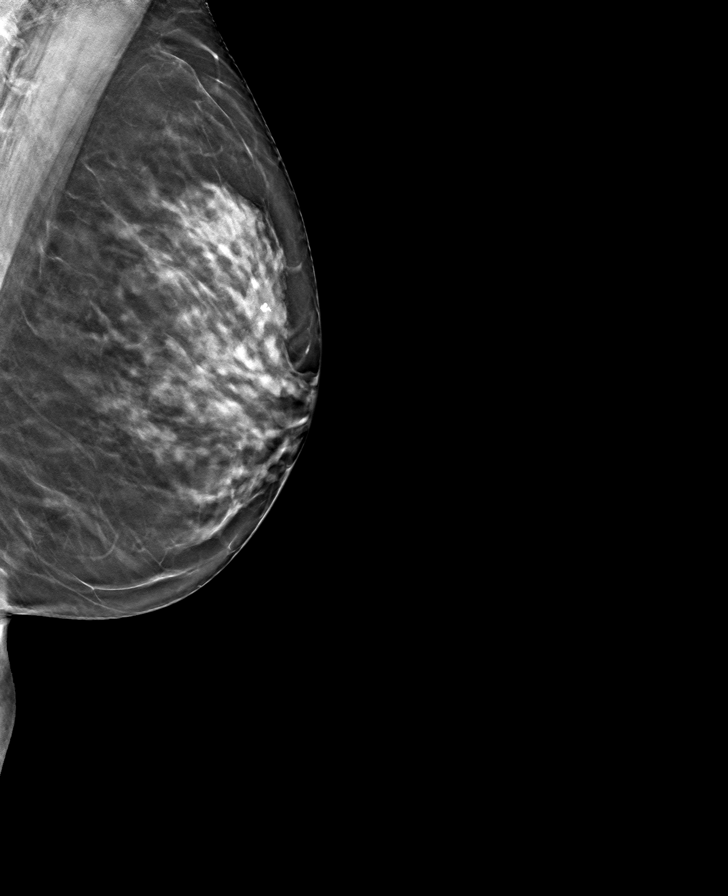

[L CC tomo · tomo slice 35/68.0]
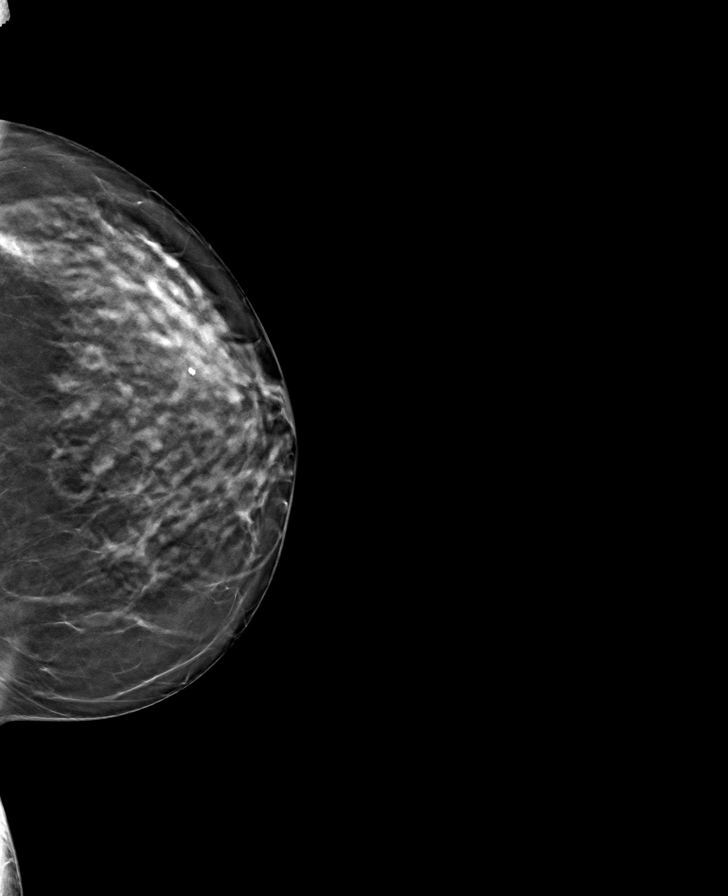

[8 of 24 positions shown; findings below may reference images not displayed]

ACR Breast Density Category c: The breast tissue is heterogeneously
dense, which may obscure small masses.
FINDINGS: There are no findings suspicious for malignancy.
IMPRESSION: No mammographic evidence of malignancy. A result letter of this
screening mammogram will be mailed directly to the patient.

RECOMMENDATION:
Screening mammogram in one year. (Code:Q3-W-BC3)

BI-RADS CATEGORY  1: Negative.

## 2022-08-24 DIAGNOSIS — M810 Age-related osteoporosis without current pathological fracture: Secondary | ICD-10-CM | POA: Diagnosis not present

## 2022-08-24 DIAGNOSIS — Z91040 Latex allergy status: Secondary | ICD-10-CM | POA: Diagnosis not present

## 2022-08-24 DIAGNOSIS — Z823 Family history of stroke: Secondary | ICD-10-CM | POA: Diagnosis not present

## 2022-08-24 DIAGNOSIS — Z833 Family history of diabetes mellitus: Secondary | ICD-10-CM | POA: Diagnosis not present

## 2022-08-24 DIAGNOSIS — Z882 Allergy status to sulfonamides status: Secondary | ICD-10-CM | POA: Diagnosis not present

## 2022-08-24 DIAGNOSIS — B009 Herpesviral infection, unspecified: Secondary | ICD-10-CM | POA: Diagnosis not present

## 2022-08-24 DIAGNOSIS — Z880 Allergy status to penicillin: Secondary | ICD-10-CM | POA: Diagnosis not present

## 2022-08-24 DIAGNOSIS — Z008 Encounter for other general examination: Secondary | ICD-10-CM | POA: Diagnosis not present

## 2022-08-24 DIAGNOSIS — Z8249 Family history of ischemic heart disease and other diseases of the circulatory system: Secondary | ICD-10-CM | POA: Diagnosis not present

## 2022-09-05 ENCOUNTER — Other Ambulatory Visit (HOSPITAL_BASED_OUTPATIENT_CLINIC_OR_DEPARTMENT_OTHER): Payer: Self-pay

## 2022-11-21 ENCOUNTER — Other Ambulatory Visit (HOSPITAL_BASED_OUTPATIENT_CLINIC_OR_DEPARTMENT_OTHER): Payer: Self-pay

## 2022-11-28 ENCOUNTER — Other Ambulatory Visit: Payer: Self-pay

## 2022-11-29 NOTE — Telephone Encounter (Signed)
Prolia VOB initiated via [email or web address]  Next Prolia inj DUE: 01/12/23

## 2022-12-02 NOTE — Telephone Encounter (Signed)
Pt ready for scheduling on or after 01/12/23  Out-of-pocket cost due at time of visit: $346 Enrolled in [employer] Co-Pay Assistance Program - $25 copay (OOP cost will be covered with [employer] Co-pay assistance card after claims have been processed)   Primary: [employer] Bronze S HMO (on ex-change) Prolia co-insurance: 20% (approximately $320.99) Admin fee co-insurance: 20% (approximately $25)  Deductible: does not apply  Prior Auth: APPROVED PA# [id number] Valid: 06/04/22-06/03/23  Secondary: N/A Prolia co-insurance:  Admin fee co-insurance:  Deductible:  Prior Auth:  PA# Valid:   ** This summary of benefits is an estimation of the patient's out-of-pocket cost. Exact cost may vary based on individual plan coverage.

## 2022-12-02 NOTE — Telephone Encounter (Signed)
Prior Auth REQUIRED for PROLIA  Prior Auth on file and valid  Prior Auth: APPROVED PA# [id number] Valid: 06/04/22-06/03/23

## 2022-12-11 ENCOUNTER — Encounter: Payer: Self-pay | Admitting: *Deleted

## 2022-12-17 ENCOUNTER — Ambulatory Visit: Payer: 59 | Admitting: Family Medicine

## 2022-12-27 ENCOUNTER — Encounter: Payer: Self-pay | Admitting: Family Medicine

## 2022-12-27 ENCOUNTER — Ambulatory Visit: Payer: 59 | Admitting: Family Medicine

## 2022-12-27 ENCOUNTER — Telehealth: Payer: Self-pay | Admitting: Family Medicine

## 2022-12-27 VITALS — BP 111/78 | HR 77 | Ht 61.0 in | Wt 127.8 lb

## 2022-12-27 DIAGNOSIS — M810 Age-related osteoporosis without current pathological fracture: Secondary | ICD-10-CM | POA: Diagnosis not present

## 2022-12-27 DIAGNOSIS — E559 Vitamin D deficiency, unspecified: Secondary | ICD-10-CM | POA: Diagnosis not present

## 2022-12-27 DIAGNOSIS — M4125 Other idiopathic scoliosis, thoracolumbar region: Secondary | ICD-10-CM

## 2022-12-27 NOTE — Progress Notes (Signed)
New patient visit   Patient: Denise Hughes   DOB: 05-13-1965   57 y.o. Female  MRN: [id number] Visit Date: 12/27/2022  Today's healthcare provider: Charlton Amor, DO   Chief Complaint  Patient presents with   New Patient (Initial Visit)    Establish care    SUBJECTIVE    Chief Complaint  Patient presents with   New Patient (Initial Visit)    Establish care   HPI HPI     New Patient (Initial Visit)    Additional comments: Establish care      Last edited by Roselyn Reef, CMA on 12/27/2022  1:59 PM.      Pt presents to establish care.   Pmh  Osteoporosis  - does not want treatment. Says that she has bad side effects to medications   Hx of scoliosis    Fam hx Htn and dm in family    Review of Systems  Constitutional:  Negative for activity change, fatigue and fever.  Respiratory:  Negative for cough and shortness of breath.   Cardiovascular:  Negative for chest pain.  Gastrointestinal:  Negative for abdominal pain.  Genitourinary:  Negative for difficulty urinating.       Current Meds  Medication Sig   Ascorbic Acid (VITAMIN C PO) Take 1,000 Int'l Units by mouth.   Melatonin-Pyridoxine (MELATONIN CR PO) Take by mouth.   Multiple Vitamins-Minerals (MULTIVITAMIN PO) Take by mouth.    valACYclovir (VALTREX) 500 MG tablet Take 1 tablet (500 mg total) by mouth 2 (two) times daily.    OBJECTIVE    BP 111/78 (BP Location: Left Arm, Patient Position: Sitting, Cuff Size: Normal)   Pulse 77   Ht 5\' 1"  (1.549 m)   Wt 127 lb 12 oz (57.9 kg)   LMP 02/12/2013 (Exact Date)   SpO2 98%   BMI 24.14 kg/m   Physical Exam Vitals and nursing note reviewed.  Constitutional:      General: She is not in acute distress.    Appearance: Normal appearance.  HENT:     Head: Normocephalic and atraumatic.     Right Ear: Tympanic membrane, ear canal and external ear normal.     Left Ear: Tympanic membrane, ear canal and external ear normal.     Nose: Nose  normal.  Eyes:     Conjunctiva/sclera: Conjunctivae normal.  Cardiovascular:     Rate and Rhythm: Normal rate and regular rhythm.  Pulmonary:     Effort: Pulmonary effort is normal.     Breath sounds: Normal breath sounds.  Abdominal:     General: Bowel sounds are normal.     Palpations: Abdomen is soft.     Tenderness: There is no abdominal tenderness.  Neurological:     General: No focal deficit present.     Mental Status: She is alert and oriented to person, place, and time.  Psychiatric:        Mood and Affect: Mood normal.        Behavior: Behavior normal.        Thought Content: Thought content normal.        Judgment: Judgment normal.        ASSESSMENT & PLAN    Problem List Items Addressed This Visit       Musculoskeletal and Integument   Osteoporosis - Primary    Not on medicine - pt will check how much vit d and calcium supplement she is on  - recommend she is on  at least 1200mg  of Ca      Other idiopathic scoliosis, thoracolumbar region     Other   Vitamin D deficiency    - will need vit d levels drawn at next visit       Return for physical whenever she wants.      No orders of the defined types were placed in this encounter.   No orders of the defined types were placed in this encounter.    Charlton Amor, DO  [facility] at [facility] [phone] (phone) [phone] (fax)  [facility]

## 2022-12-27 NOTE — Telephone Encounter (Signed)
Patient would like to come before 12/12 to have labs done for physical.

## 2022-12-27 NOTE — Assessment & Plan Note (Signed)
-   will need vit d levels drawn at next visit

## 2022-12-27 NOTE — Assessment & Plan Note (Signed)
Not on medicine - pt will check how much vit d and calcium supplement she is on  - recommend she is on at least 1200mg  of Ca

## 2022-12-31 ENCOUNTER — Other Ambulatory Visit: Payer: Self-pay | Admitting: Family Medicine

## 2022-12-31 DIAGNOSIS — Z0000 Encounter for general adult medical examination without abnormal findings: Secondary | ICD-10-CM

## 2022-12-31 NOTE — Telephone Encounter (Signed)
LM for pt that labs had been placed

## 2023-01-24 ENCOUNTER — Encounter: Payer: 59 | Admitting: Family Medicine

## 2023-03-14 ENCOUNTER — Ambulatory Visit (HOSPITAL_BASED_OUTPATIENT_CLINIC_OR_DEPARTMENT_OTHER): Payer: 59 | Admitting: Obstetrics & Gynecology

## 2023-03-19 ENCOUNTER — Other Ambulatory Visit: Payer: Self-pay

## 2023-03-19 ENCOUNTER — Ambulatory Visit
Admission: EM | Admit: 2023-03-19 | Discharge: 2023-03-19 | Disposition: A | Discharge status: Home / Self Care | Payer: 59 | Attending: Family Medicine | Admitting: Family Medicine

## 2023-03-19 DIAGNOSIS — J019 Acute sinusitis, unspecified: Secondary | ICD-10-CM | POA: Diagnosis not present

## 2023-03-19 MED ORDER — DOXYCYCLINE HYCLATE 100 MG PO CAPS: 100.0000 mg | ORAL_CAPSULE | Freq: Two times a day (BID) | ORAL | 0 refills | Status: AC

## 2023-03-19 NOTE — ED Triage Notes (Addendum)
 Pt presents with complaints of cough, nasal congestion, headaches, and fevers x 1 week. Pt states her symptoms are only becoming worse, I just feel like there is so much pressure in my head. Pt currently rates her overall pain an 8/10. Pt states she has been taking natural supplements with no relief.
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
## 2023-03-19 NOTE — Discharge Instructions (Addendum)
Follow up with your doctor if no improvement

## 2023-03-22 ENCOUNTER — Telehealth: Payer: Self-pay | Admitting: Physician Assistant

## 2023-03-22 MED ORDER — GUAIFENESIN ER 1200 MG PO TB12: 1.0000 | ORAL_TABLET | Freq: Two times a day (BID) | ORAL | 0 refills | Status: AC

## 2023-03-22 NOTE — Telephone Encounter (Signed)
 Patient called requesting guaifenesin , it is available over-the-counter

## 2023-03-22 NOTE — Telephone Encounter (Signed)
 Patient requesting a particular guaifenesin  extended release.  Rx sent to pharmacy

## 2023-05-01 ENCOUNTER — Other Ambulatory Visit (HOSPITAL_BASED_OUTPATIENT_CLINIC_OR_DEPARTMENT_OTHER): Payer: Self-pay

## 2023-05-28 ENCOUNTER — Ambulatory Visit (HOSPITAL_BASED_OUTPATIENT_CLINIC_OR_DEPARTMENT_OTHER): Payer: 59 | Admitting: Obstetrics & Gynecology

## 2023-06-20 ENCOUNTER — Encounter: Payer: Self-pay | Admitting: Family Medicine

## 2023-07-15 ENCOUNTER — Encounter (HOSPITAL_BASED_OUTPATIENT_CLINIC_OR_DEPARTMENT_OTHER): Payer: Self-pay | Admitting: Obstetrics & Gynecology

## 2023-07-15 ENCOUNTER — Other Ambulatory Visit (HOSPITAL_BASED_OUTPATIENT_CLINIC_OR_DEPARTMENT_OTHER): Payer: Self-pay

## 2023-07-15 ENCOUNTER — Other Ambulatory Visit (HOSPITAL_COMMUNITY)
Admission: RE | Admit: 2023-07-15 | Discharge: 2023-07-15 | Disposition: A | Discharge status: Home / Self Care | Source: Ambulatory Visit | Attending: Obstetrics & Gynecology | Admitting: Obstetrics & Gynecology

## 2023-07-15 ENCOUNTER — Ambulatory Visit (HOSPITAL_BASED_OUTPATIENT_CLINIC_OR_DEPARTMENT_OTHER): Payer: 59 | Admitting: Obstetrics & Gynecology

## 2023-07-15 VITALS — BP 124/84 | HR 65 | Ht 61.0 in | Wt 114.0 lb

## 2023-07-15 DIAGNOSIS — Z124 Encounter for screening for malignant neoplasm of cervix: Secondary | ICD-10-CM

## 2023-07-15 DIAGNOSIS — M810 Age-related osteoporosis without current pathological fracture: Secondary | ICD-10-CM

## 2023-07-15 DIAGNOSIS — A609 Anogenital herpesviral infection, unspecified: Secondary | ICD-10-CM

## 2023-07-15 DIAGNOSIS — N901 Moderate vulvar dysplasia: Secondary | ICD-10-CM | POA: Diagnosis not present

## 2023-07-15 DIAGNOSIS — Z860101 Personal history of adenomatous and serrated colon polyps: Secondary | ICD-10-CM

## 2023-07-15 DIAGNOSIS — Z01411 Encounter for gynecological examination (general) (routine) with abnormal findings: Secondary | ICD-10-CM | POA: Diagnosis not present

## 2023-07-15 DIAGNOSIS — I2510 Atherosclerotic heart disease of native coronary artery without angina pectoris: Secondary | ICD-10-CM

## 2023-07-15 DIAGNOSIS — Z1231 Encounter for screening mammogram for malignant neoplasm of breast: Secondary | ICD-10-CM

## 2023-07-15 DIAGNOSIS — N952 Postmenopausal atrophic vaginitis: Secondary | ICD-10-CM | POA: Diagnosis not present

## 2023-07-15 DIAGNOSIS — Z01419 Encounter for gynecological examination (general) (routine) without abnormal findings: Secondary | ICD-10-CM

## 2023-07-15 MED ORDER — VALACYCLOVIR HCL 500 MG PO TABS
500.0000 mg | ORAL_TABLET | Freq: Two times a day (BID) | ORAL | 3 refills | Status: AC
  Filled 2023-07-15 – 2023-09-23 (×2): qty 60, 30d supply, fill #0
  Filled 2023-10-20: qty 60, 30d supply, fill #1
  Filled 2023-11-25: qty 60, 30d supply, fill #2
  Filled 2023-12-31: qty 60, 30d supply, fill #3
  Filled 2024-01-29 – 2024-02-11 (×2): qty 60, 30d supply, fill #4
  Filled 2024-03-11: qty 60, 30d supply, fill #5

## 2023-07-15 NOTE — Progress Notes (Signed)
 ANNUAL EXAM Patient name: Denise Hughes MRN [id number]  Date of birth: [birth year] Chief Complaint:   Annual Exam  History of Present Illness:   Denise Hughes is a 58 y.o. G74P0020 Caucasian female being seen today for a routine annual exam.  Denies vaginal bleeding.  Her PCP is leaving so needs suggestions.  Has been seeing sports medicine and doing pilates.  This provider [place].  She and I discussed repeating her dexa scan as was done 2022.  She is taking calcium  and Vit D.    Patient's last menstrual period was 02/12/2013 (exact date).   Last pap 07/26/2020. Results were: NILM w/ HRHPV negative.  Last mammogram:02/01/2022. Results were: normal. Family h/o breast cancer: no Last colonoscopy:  01/2018, follow up 5 years.  Pt aware this is due.   Has not received any notification yet.  She will call.     01/23/2022    3:49 PM 07/24/2021    2:17 PM 01/20/2021   11:33 AM 01/12/2021    6:26 PM 12/21/2020    2:08 PM  Depression screen PHQ 2/9  Decreased Interest 0 0 0 0 0  Down, Depressed, Hopeless 0 0 0 0 0  PHQ - 2 Score 0 0 0 0 0  Difficult doing work/chores    Not difficult at all         12/17/2018    1:45 PM 11/05/2018    2:36 PM  GAD 7 : Generalized Anxiety Score  Nervous, Anxious, on Edge 3 3  Control/stop worrying 3 3  Worry too much - different things 3 3  Trouble relaxing 3 3  Restless 1 2  Easily annoyed or irritable 3 3  Afraid - awful might happen 3 3  Total GAD 7 Score 19 20  Anxiety Difficulty Somewhat difficult Somewhat difficult     Review of Systems:   Pertinent items are noted in HPI Denies any headaches, blurred vision, fatigue, shortness of breath, chest pain, abdominal pain, abnormal vaginal discharge/itching/odor/irritation, problems with periods, bowel movements, urination, or intercourse unless otherwise stated above. Pertinent History Reviewed:  Reviewed past medical,surgical, social and family history.  Reviewed problem list,  medications and allergies. Physical Assessment:   Vitals:   07/15/23 1532  BP: 124/84  Pulse: 65  Weight: 114 lb (51.7 kg)  Height: 5\' 1"  (1.549 m)  Body mass index is 21.54 kg/m.        Physical Examination:   General appearance - well appearing, and in no distress  Mental status - alert, oriented to person, place, and time  Psych:  She has a normal mood and affect  Skin - warm and dry, normal color, no suspicious lesions noted  Chest - effort normal, all lung fields clear to auscultation bilaterally  Heart - normal rate and regular rhythm  Neck:  midline trachea, no thyromegaly or nodules  Breasts - breasts appear normal, no suspicious masses, no skin or nipple changes or  axillary nodes  Abdomen - soft, nontender, nondistended, no masses or organomegaly  Pelvic - VULVA: normal appearing vulva with no masses, tenderness or lesions   VAGINA: normal appearing vagina with normal color and discharge, no lesions   CERVIX: normal appearing cervix without discharge or lesions, no CMT  Thin prep pap is done with HR HPV cotesting  UTERUS: uterus is felt to be normal size, shape, consistency and nontender   ADNEXA: No adnexal masses or tenderness noted.  Rectal - normal rectal, good sphincter tone, no masses  felt.   Extremities:  No swelling or varicosities noted  Chaperone present for exam  Assessment & Plan:  1. Well woman exam with routine gynecological exam (Primary) - Pap smear and HR HPV will be updated today - Mammogram overdue.  Pt aware.  Order placed.  - Colonoscopy 2019.  Follow up 5 years.  Pt is going to call and see if this is needed now. - Bone mineral density ordered - vaccines reviewed/updated  2. History of adenomatous polyp of colon  3. Atherosclerosis of left anterior descending (LAD) artery  4. HSV (herpes simplex virus) anogenital infection - valACYclovir  (VALTREX ) 500 MG tablet; Take 1 tablet (500 mg total) by mouth 2 (two) times daily.  Dispense: 360  tablet; Refill: 3  5. Age-related osteoporosis without current pathological fracture - DG BONE DENSITY (DXA); Future  6. Atrophic vaginitis  7. Vulvar intraepithelial neoplasia (VIN) grade 2 - vulvar exam is normal today    Meds:  Meds ordered this encounter  Medications   valACYclovir  (VALTREX ) 500 MG tablet    Sig: Take 1 tablet (500 mg total) by mouth 2 (two) times daily.    Dispense:  360 tablet    Refill:  3    Do not dispense the blue tablets.  Just the white ones please.    Follow-up: Return in about 1 year (around 07/14/2024).  [name], MD 07/15/2023 4:25 PM

## 2023-07-19 ENCOUNTER — Ambulatory Visit (HOSPITAL_BASED_OUTPATIENT_CLINIC_OR_DEPARTMENT_OTHER): Payer: Self-pay | Admitting: Obstetrics & Gynecology

## 2023-07-19 LAB — CYTOLOGY - PAP
Comment: NEGATIVE
Diagnosis: NEGATIVE
High risk HPV: NEGATIVE

## 2023-07-25 ENCOUNTER — Other Ambulatory Visit (HOSPITAL_BASED_OUTPATIENT_CLINIC_OR_DEPARTMENT_OTHER): Payer: Self-pay

## 2023-07-26 ENCOUNTER — Ambulatory Visit: Admitting: Family Medicine

## 2023-07-29 ENCOUNTER — Encounter: Payer: Self-pay | Admitting: Family Medicine

## 2023-07-29 ENCOUNTER — Ambulatory Visit: Admitting: Family Medicine

## 2023-07-29 VITALS — BP 115/83 | Ht 62.0 in | Wt 114.0 lb

## 2023-07-29 DIAGNOSIS — M810 Age-related osteoporosis without current pathological fracture: Secondary | ICD-10-CM | POA: Diagnosis not present

## 2023-07-29 DIAGNOSIS — M546 Pain in thoracic spine: Secondary | ICD-10-CM

## 2023-07-29 DIAGNOSIS — M940 Chondrocostal junction syndrome [Tietze]: Secondary | ICD-10-CM

## 2023-07-29 NOTE — Assessment & Plan Note (Signed)
 Discussed options such as bisphosphonates and Prolia, patient prefers to avoid medical management at this time.  She is agreeable to continuing exercises with light loadbearing to help with osteoporosis. -Continue regular Pilates classes -Recommended to start light weight training exercises.  Provided information for osteo strong -Continue daily vitamin D  and calcium  supplements -Consider starting medications in future if patient is willing

## 2023-07-29 NOTE — Patient Instructions (Signed)
 Hello Denise Hughes-  It was a pleasure meeting you today.  We recommend you consider reaching out to [facility] regarding ongoing treatment for your osteoporosis.  We have gotten very good feedback from folks in our department that have used them in the past for patients and also for themselves.  [email or web address] Phone: [phone]

## 2023-07-29 NOTE — Progress Notes (Unsigned)
    SUBJECTIVE:   CHIEF COMPLAINT: back pain, osteoporosis  HPI:  Denise Hughes is a 58yo F w/ hx of osteoporosis that presents for back/side pain - About 1 wk ago, was reaching for something in her car and then pulled her sides. She thinks she may have injured her ribs.  Since then, she reports that the rib pain is improving close to resolved at this point.  However she has a history of osteoporosis which she also wanted to establish care here for. - She was previously seeing Dr. Dorothey Gate for sports medicine. They had started her ona pilated exercise program for her osteoporosis. She is hesitant at this time to start biphosphonates (due to risk of osteonecrosis) and prolia (given family hx of adverse reactions to shots).  - She taking a daily vitamin D  and calcium  supplement - She does not have hx of fractures  OBJECTIVE:   BP 115/83 (BP Location: Left Arm, Patient Position: Sitting, Cuff Size: Normal)   Ht 5' 2 (1.575 m)   Wt 114 lb (51.7 kg)   LMP 02/12/2013 (Exact Date)   BMI 20.85 kg/m   General: Alert, pleasant woman. NAD. HEENT: NCAT. MMM. CV: RRR, no murmurs.   Resp: CTAB, no wheezing or crackles. Normal WOB on RA.  Abm: Soft, nontender, nondistended. BS present. Ext: Moves all ext spontaneously Skin: Warm, well perfused   ASSESSMENT/PLAN:   Assessment & Plan Acute costochondritis Resolved at this point. Continue conservative management.  Osteoporosis, unspecified osteoporosis type, unspecified pathological fracture presence Discussed options such as bisphosphonates and Prolia, patient prefers to avoid medical management at this time.  She is agreeable to continuing exercises with light loadbearing to help with osteoporosis. -Continue regular Pilates classes -Recommended to start light weight training exercises.  Provided information for osteo strong -Continue daily vitamin D  and calcium  supplements -Consider starting medications in future if patient is willing     Albin Huh, MD [facility]

## 2023-09-11 ENCOUNTER — Encounter (HOSPITAL_BASED_OUTPATIENT_CLINIC_OR_DEPARTMENT_OTHER): Payer: Self-pay | Admitting: Obstetrics & Gynecology

## 2023-09-20 ENCOUNTER — Inpatient Hospital Stay (HOSPITAL_BASED_OUTPATIENT_CLINIC_OR_DEPARTMENT_OTHER): Admission: RE | Admit: 2023-09-20 | Source: Ambulatory Visit | Admitting: Radiology

## 2023-09-23 ENCOUNTER — Ambulatory Visit (HOSPITAL_BASED_OUTPATIENT_CLINIC_OR_DEPARTMENT_OTHER): Admission: RE | Admit: 2023-09-23 | Source: Ambulatory Visit | Admitting: Radiology

## 2023-09-23 ENCOUNTER — Other Ambulatory Visit (HOSPITAL_BASED_OUTPATIENT_CLINIC_OR_DEPARTMENT_OTHER): Payer: Self-pay

## 2023-09-24 ENCOUNTER — Ambulatory Visit (HOSPITAL_BASED_OUTPATIENT_CLINIC_OR_DEPARTMENT_OTHER): Admitting: Radiology

## 2023-09-25 ENCOUNTER — Other Ambulatory Visit (HOSPITAL_BASED_OUTPATIENT_CLINIC_OR_DEPARTMENT_OTHER): Payer: Self-pay

## 2023-09-27 DIAGNOSIS — K219 Gastro-esophageal reflux disease without esophagitis: Secondary | ICD-10-CM | POA: Diagnosis not present

## 2023-09-27 DIAGNOSIS — I2510 Atherosclerotic heart disease of native coronary artery without angina pectoris: Secondary | ICD-10-CM | POA: Diagnosis not present

## 2023-09-27 DIAGNOSIS — Z1339 Encounter for screening examination for other mental health and behavioral disorders: Secondary | ICD-10-CM | POA: Diagnosis not present

## 2023-09-27 DIAGNOSIS — Z713 Dietary counseling and surveillance: Secondary | ICD-10-CM | POA: Diagnosis not present

## 2023-09-27 DIAGNOSIS — Z1331 Encounter for screening for depression: Secondary | ICD-10-CM | POA: Diagnosis not present

## 2023-09-27 DIAGNOSIS — M810 Age-related osteoporosis without current pathological fracture: Secondary | ICD-10-CM | POA: Diagnosis not present

## 2023-09-27 DIAGNOSIS — E785 Hyperlipidemia, unspecified: Secondary | ICD-10-CM | POA: Diagnosis not present

## 2023-09-27 DIAGNOSIS — E538 Deficiency of other specified B group vitamins: Secondary | ICD-10-CM | POA: Diagnosis not present

## 2023-09-27 DIAGNOSIS — J301 Allergic rhinitis due to pollen: Secondary | ICD-10-CM | POA: Diagnosis not present

## 2023-09-27 DIAGNOSIS — Z860101 Personal history of adenomatous and serrated colon polyps: Secondary | ICD-10-CM | POA: Diagnosis not present

## 2023-09-30 ENCOUNTER — Encounter (HOSPITAL_BASED_OUTPATIENT_CLINIC_OR_DEPARTMENT_OTHER): Payer: Self-pay | Admitting: Radiology

## 2023-09-30 ENCOUNTER — Ambulatory Visit (HOSPITAL_BASED_OUTPATIENT_CLINIC_OR_DEPARTMENT_OTHER)
Admission: RE | Admit: 2023-09-30 | Discharge: 2023-09-30 | Disposition: A | Discharge status: Home / Self Care | Source: Ambulatory Visit | Attending: Obstetrics & Gynecology | Admitting: Obstetrics & Gynecology

## 2023-09-30 DIAGNOSIS — Z1231 Encounter for screening mammogram for malignant neoplasm of breast: Secondary | ICD-10-CM | POA: Diagnosis not present

## 2023-10-21 ENCOUNTER — Other Ambulatory Visit (HOSPITAL_BASED_OUTPATIENT_CLINIC_OR_DEPARTMENT_OTHER): Payer: Self-pay

## 2023-10-22 ENCOUNTER — Other Ambulatory Visit (HOSPITAL_BASED_OUTPATIENT_CLINIC_OR_DEPARTMENT_OTHER): Payer: Self-pay

## 2023-10-23 ENCOUNTER — Other Ambulatory Visit (HOSPITAL_BASED_OUTPATIENT_CLINIC_OR_DEPARTMENT_OTHER): Payer: Self-pay

## 2023-11-25 ENCOUNTER — Other Ambulatory Visit (HOSPITAL_BASED_OUTPATIENT_CLINIC_OR_DEPARTMENT_OTHER): Payer: Self-pay

## 2023-11-26 ENCOUNTER — Other Ambulatory Visit (HOSPITAL_BASED_OUTPATIENT_CLINIC_OR_DEPARTMENT_OTHER): Payer: Self-pay

## 2023-11-27 ENCOUNTER — Other Ambulatory Visit (HOSPITAL_COMMUNITY): Payer: Self-pay

## 2023-12-05 DIAGNOSIS — M810 Age-related osteoporosis without current pathological fracture: Secondary | ICD-10-CM | POA: Diagnosis not present

## 2023-12-10 ENCOUNTER — Other Ambulatory Visit: Payer: Self-pay

## 2023-12-13 DIAGNOSIS — K219 Gastro-esophageal reflux disease without esophagitis: Secondary | ICD-10-CM | POA: Diagnosis not present

## 2023-12-13 DIAGNOSIS — M419 Scoliosis, unspecified: Secondary | ICD-10-CM | POA: Diagnosis not present

## 2023-12-13 DIAGNOSIS — M810 Age-related osteoporosis without current pathological fracture: Secondary | ICD-10-CM | POA: Diagnosis not present

## 2023-12-31 ENCOUNTER — Other Ambulatory Visit (HOSPITAL_BASED_OUTPATIENT_CLINIC_OR_DEPARTMENT_OTHER): Payer: Self-pay

## 2023-12-31 ENCOUNTER — Other Ambulatory Visit: Payer: Self-pay

## 2024-01-01 ENCOUNTER — Other Ambulatory Visit (HOSPITAL_BASED_OUTPATIENT_CLINIC_OR_DEPARTMENT_OTHER): Payer: Self-pay

## 2024-01-21 ENCOUNTER — Encounter (HOSPITAL_BASED_OUTPATIENT_CLINIC_OR_DEPARTMENT_OTHER): Payer: Self-pay | Admitting: Obstetrics & Gynecology

## 2024-01-21 ENCOUNTER — Other Ambulatory Visit (HOSPITAL_BASED_OUTPATIENT_CLINIC_OR_DEPARTMENT_OTHER): Payer: Self-pay

## 2024-01-21 ENCOUNTER — Ambulatory Visit (HOSPITAL_BASED_OUTPATIENT_CLINIC_OR_DEPARTMENT_OTHER): Admitting: Obstetrics & Gynecology

## 2024-01-21 VITALS — BP 127/88 | HR 75 | Ht 61.0 in | Wt 115.6 lb

## 2024-01-21 DIAGNOSIS — M810 Age-related osteoporosis without current pathological fracture: Secondary | ICD-10-CM

## 2024-01-21 DIAGNOSIS — I2510 Atherosclerotic heart disease of native coronary artery without angina pectoris: Secondary | ICD-10-CM | POA: Diagnosis not present

## 2024-01-21 DIAGNOSIS — N952 Postmenopausal atrophic vaginitis: Secondary | ICD-10-CM

## 2024-01-21 DIAGNOSIS — A609 Anogenital herpesviral infection, unspecified: Secondary | ICD-10-CM

## 2024-01-21 MED ORDER — ESTRADIOL 10 MCG VA TABS
1.0000 | ORAL_TABLET | VAGINAL | 4 refills | Status: AC
  Filled 2024-01-21: qty 8, 28d supply, fill #0
  Filled 2024-03-11: qty 8, 28d supply, fill #1
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
## 2024-01-27 ENCOUNTER — Telehealth (HOSPITAL_BASED_OUTPATIENT_CLINIC_OR_DEPARTMENT_OTHER): Payer: Self-pay

## 2024-01-27 NOTE — Telephone Encounter (Signed)
 Patient would like to know what location Dr. Cleotilde wants her to get her bone density test done? Would like for someone to call her with the information.

## 2024-01-29 ENCOUNTER — Other Ambulatory Visit (HOSPITAL_BASED_OUTPATIENT_CLINIC_OR_DEPARTMENT_OTHER)

## 2024-01-29 ENCOUNTER — Other Ambulatory Visit (HOSPITAL_BASED_OUTPATIENT_CLINIC_OR_DEPARTMENT_OTHER): Payer: Self-pay

## 2024-01-30 ENCOUNTER — Other Ambulatory Visit (HOSPITAL_BASED_OUTPATIENT_CLINIC_OR_DEPARTMENT_OTHER): Payer: Self-pay

## 2024-02-04 ENCOUNTER — Other Ambulatory Visit (HOSPITAL_BASED_OUTPATIENT_CLINIC_OR_DEPARTMENT_OTHER): Payer: Self-pay

## 2024-02-05 ENCOUNTER — Other Ambulatory Visit (HOSPITAL_COMMUNITY): Payer: Self-pay

## 2024-02-11 ENCOUNTER — Other Ambulatory Visit (HOSPITAL_BASED_OUTPATIENT_CLINIC_OR_DEPARTMENT_OTHER): Payer: Self-pay

## 2024-02-17 ENCOUNTER — Ambulatory Visit (HOSPITAL_BASED_OUTPATIENT_CLINIC_OR_DEPARTMENT_OTHER): Admitting: Obstetrics & Gynecology

## 2024-03-11 ENCOUNTER — Other Ambulatory Visit (HOSPITAL_BASED_OUTPATIENT_CLINIC_OR_DEPARTMENT_OTHER): Payer: Self-pay

## 2024-03-11 ENCOUNTER — Other Ambulatory Visit (HOSPITAL_COMMUNITY): Payer: Self-pay

## 2024-03-12 ENCOUNTER — Other Ambulatory Visit (HOSPITAL_BASED_OUTPATIENT_CLINIC_OR_DEPARTMENT_OTHER): Payer: Self-pay

## 2024-03-15 ENCOUNTER — Other Ambulatory Visit: Payer: Self-pay

## 2024-07-31 ENCOUNTER — Ambulatory Visit (HOSPITAL_BASED_OUTPATIENT_CLINIC_OR_DEPARTMENT_OTHER): Admitting: Obstetrics & Gynecology
